# Patient Record
Sex: Female | Born: 1935 | Race: Black or African American | Hispanic: No | Marital: Single | Smoking: Never smoker
Health system: Southern US, Community
[De-identification: ages and names within clinical notes are randomized; demographics above are authoritative.]

## PROBLEM LIST (undated history)

## (undated) DIAGNOSIS — M1990 Unspecified osteoarthritis, unspecified site: Secondary | ICD-10-CM

## (undated) DIAGNOSIS — R627 Adult failure to thrive: Secondary | ICD-10-CM

## (undated) DIAGNOSIS — M109 Gout, unspecified: Secondary | ICD-10-CM

## (undated) DIAGNOSIS — R64 Cachexia: Secondary | ICD-10-CM

## (undated) DIAGNOSIS — D61818 Other pancytopenia: Secondary | ICD-10-CM

## (undated) DIAGNOSIS — I2510 Atherosclerotic heart disease of native coronary artery without angina pectoris: Secondary | ICD-10-CM

## (undated) DIAGNOSIS — H409 Unspecified glaucoma: Secondary | ICD-10-CM

## (undated) DIAGNOSIS — E785 Hyperlipidemia, unspecified: Secondary | ICD-10-CM

## (undated) DIAGNOSIS — I495 Sick sinus syndrome: Secondary | ICD-10-CM

## (undated) DIAGNOSIS — I10 Essential (primary) hypertension: Secondary | ICD-10-CM

## (undated) DIAGNOSIS — I119 Hypertensive heart disease without heart failure: Secondary | ICD-10-CM

## (undated) HISTORY — DX: Unspecified glaucoma: H40.9

## (undated) HISTORY — DX: Cachexia: R64

## (undated) HISTORY — DX: Essential (primary) hypertension: I10

## (undated) HISTORY — PX: CATARACT EXTRACTION: SUR2

## (undated) HISTORY — DX: Adult failure to thrive: R62.7

## (undated) HISTORY — DX: Sick sinus syndrome: I49.5

## (undated) HISTORY — PX: TOTAL HIP ARTHROPLASTY: SHX124

---

## 1998-09-28 ENCOUNTER — Encounter: Payer: Self-pay | Admitting: Cardiology

## 1998-09-28 ENCOUNTER — Ambulatory Visit (HOSPITAL_COMMUNITY): Admission: RE | Admit: 1998-09-28 | Discharge: 1998-09-28 | Payer: Self-pay | Admitting: Cardiology

## 1999-08-02 ENCOUNTER — Encounter: Admission: RE | Admit: 1999-08-02 | Discharge: 1999-08-02 | Payer: Self-pay | Admitting: Unknown Physician Specialty

## 1999-08-05 ENCOUNTER — Encounter: Admission: RE | Admit: 1999-08-05 | Discharge: 1999-08-05 | Payer: Self-pay

## 2003-11-20 ENCOUNTER — Emergency Department (HOSPITAL_COMMUNITY): Admission: EM | Admit: 2003-11-20 | Discharge: 2003-11-20 | Payer: Self-pay | Admitting: Emergency Medicine

## 2004-01-28 ENCOUNTER — Encounter: Admission: RE | Admit: 2004-01-28 | Discharge: 2004-01-28 | Payer: Self-pay

## 2004-02-14 ENCOUNTER — Encounter: Admission: RE | Admit: 2004-02-14 | Discharge: 2004-02-14 | Payer: Self-pay

## 2004-02-28 ENCOUNTER — Encounter: Admission: RE | Admit: 2004-02-28 | Discharge: 2004-02-28 | Payer: Self-pay

## 2004-03-14 ENCOUNTER — Encounter: Admission: RE | Admit: 2004-03-14 | Discharge: 2004-03-14 | Payer: Self-pay

## 2004-07-15 ENCOUNTER — Ambulatory Visit: Payer: Self-pay | Admitting: Internal Medicine

## 2004-09-27 ENCOUNTER — Ambulatory Visit: Payer: Self-pay | Admitting: Oncology

## 2004-11-12 ENCOUNTER — Ambulatory Visit: Payer: Self-pay | Admitting: Internal Medicine

## 2005-04-03 IMAGING — CR DG CHEST 1V PORT
1 series · 1 of 1 positions shown · non-contrast
Comparison: none

CLINICAL DATA: Arm and chest pain.  
 PORTABLE CHEST 
 AP view of the chest dated November 20, 2003 is reviewed without a prior film.  The heart is thought to be upper limits of normal in size.  No frank congestive failure is noted.  The markings are prominent. There is some soft tissue fullness in the right peritracheal region near the apex thought most likely to be vascular.  
 IMPRESSION
 Probably negative chest for active disease.  There is some fullness in the right peritracheal area which may be vascular.  PA and lateral views of the chest may be helpful in better resolving this when the patient?s condition permits.

[view not recorded]
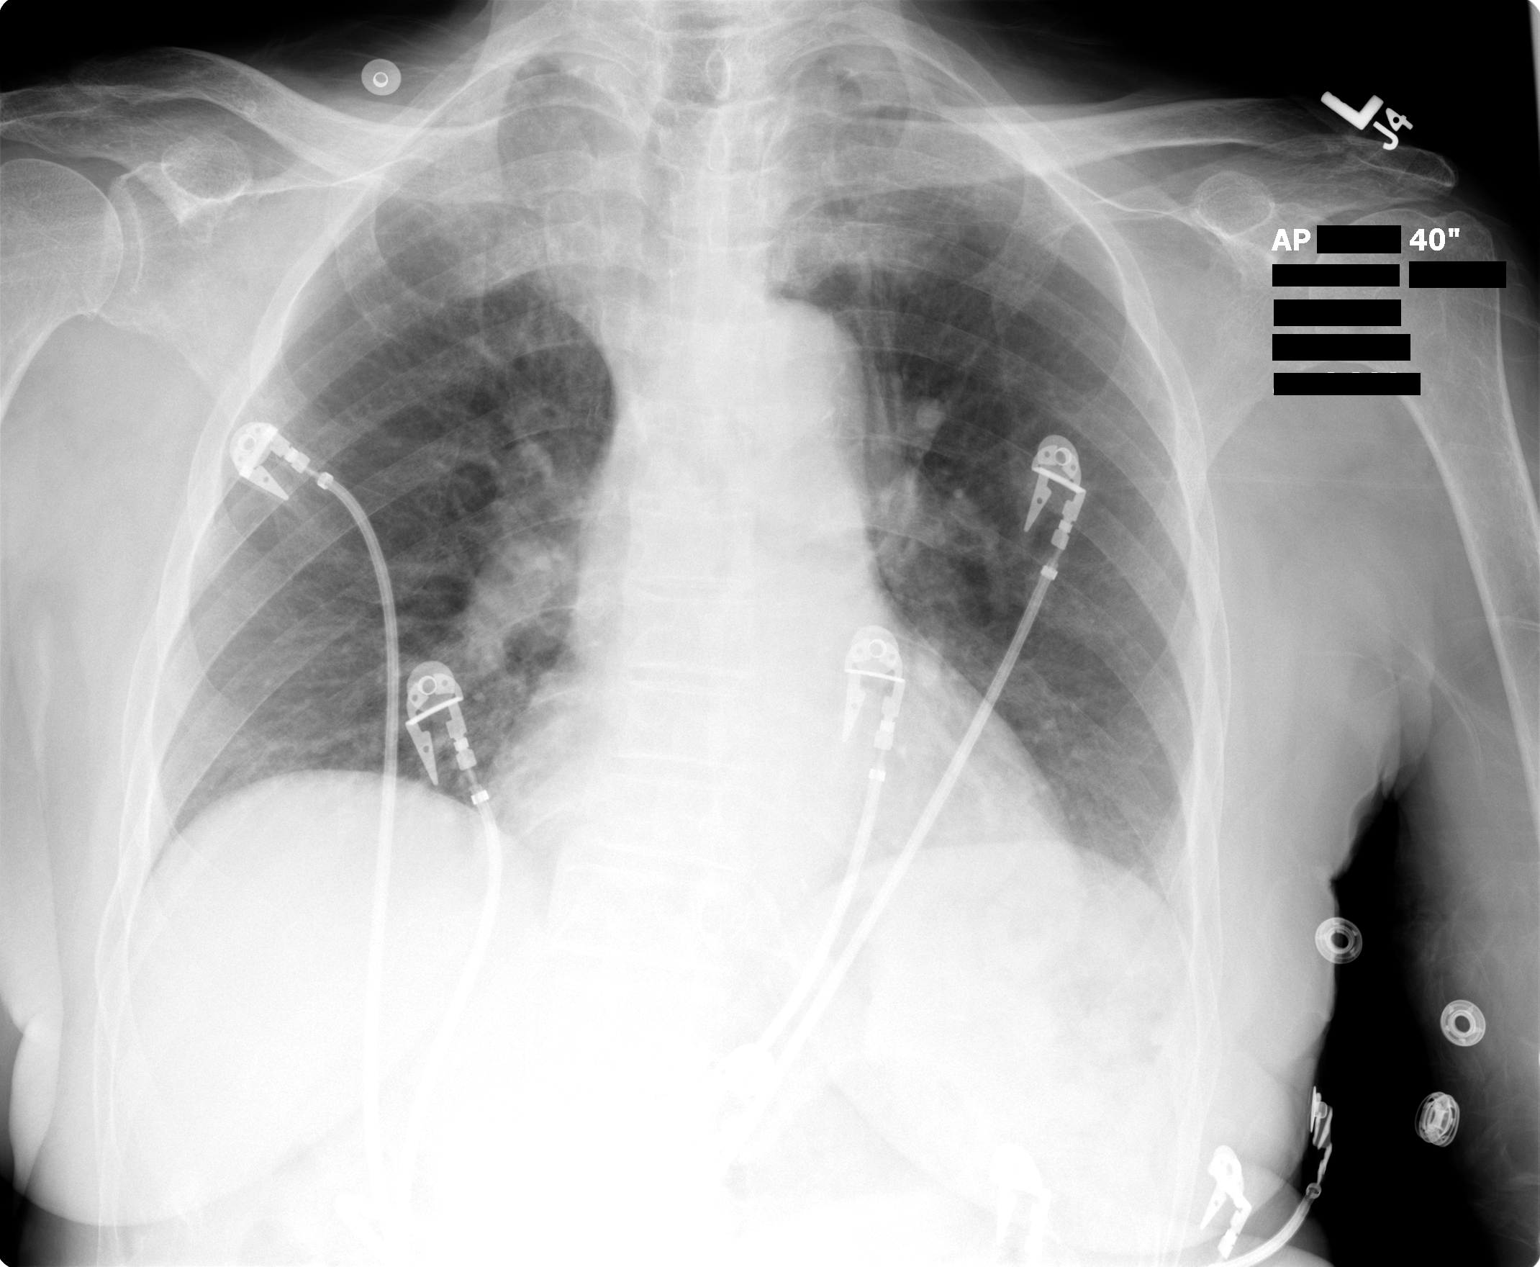

[1 of 1 positions shown; findings below may reference images not displayed]

## 2005-05-15 ENCOUNTER — Ambulatory Visit: Payer: Self-pay | Admitting: Internal Medicine

## 2005-11-14 ENCOUNTER — Ambulatory Visit: Payer: Self-pay | Admitting: Internal Medicine

## 2006-05-18 ENCOUNTER — Ambulatory Visit: Payer: Self-pay | Admitting: Internal Medicine

## 2006-05-18 LAB — CONVERTED CEMR LAB
Eosinophil percent: 6.8 % — ABNORMAL HIGH (ref 0.0–5.0)
HCT: 32.5 % — ABNORMAL LOW (ref 36.0–46.0)
Hemoglobin: 10.6 g/dL — ABNORMAL LOW (ref 12.0–15.0)
Lymphocytes Relative: 34.9 % (ref 12.0–46.0)
MCHC: 32.7 g/dL (ref 30.0–36.0)
MCV: 82.4 fL (ref 78.0–100.0)
Monocytes Absolute: 0.5 10*3/uL (ref 0.2–0.7)
Platelets: 113 10*3/uL — ABNORMAL LOW (ref 150–400)
RBC: 3.95 M/uL (ref 3.87–5.11)
RDW: 12.8 % (ref 11.5–14.6)
WBC: 3.5 10*3/uL — ABNORMAL LOW (ref 4.5–10.5)

## 2006-11-16 ENCOUNTER — Ambulatory Visit: Payer: Self-pay | Admitting: Internal Medicine

## 2006-11-16 LAB — CONVERTED CEMR LAB
ALT: 11 units/L (ref 0–40)
Albumin: 3.9 g/dL (ref 3.5–5.2)
Alkaline Phosphatase: 67 units/L (ref 39–117)
Basophils Relative: 0 % (ref 0.0–1.0)
Bilirubin, Direct: 0.1 mg/dL (ref 0.0–0.3)
Chloride: 110 meq/L (ref 96–112)
Cholesterol: 168 mg/dL (ref 0–200)
Eosinophils Absolute: 0.1 10*3/uL (ref 0.0–0.6)
GFR calc non Af Amer: 35 mL/min
HCT: 34 % — ABNORMAL LOW (ref 36.0–46.0)
Lymphocytes Relative: 25.2 % (ref 12.0–46.0)
MCHC: 33.8 g/dL (ref 30.0–36.0)
Monocytes Relative: 12.6 % — ABNORMAL HIGH (ref 3.0–11.0)
Neutrophils Relative %: 60.5 % (ref 43.0–77.0)
Platelets: 108 10*3/uL — ABNORMAL LOW (ref 150–400)
Potassium: 4.8 meq/L (ref 3.5–5.1)
Sodium: 145 meq/L (ref 135–145)
TSH: 2.7 microintl units/mL (ref 0.35–5.50)
WBC: 3.9 10*3/uL — ABNORMAL LOW (ref 4.5–10.5)

## 2007-05-17 ENCOUNTER — Ambulatory Visit: Payer: Self-pay | Admitting: Internal Medicine

## 2007-05-17 DIAGNOSIS — E785 Hyperlipidemia, unspecified: Secondary | ICD-10-CM

## 2007-05-17 DIAGNOSIS — I119 Hypertensive heart disease without heart failure: Secondary | ICD-10-CM

## 2007-05-17 DIAGNOSIS — M1990 Unspecified osteoarthritis, unspecified site: Secondary | ICD-10-CM | POA: Insufficient documentation

## 2007-05-17 DIAGNOSIS — M109 Gout, unspecified: Secondary | ICD-10-CM

## 2007-05-17 DIAGNOSIS — I2510 Atherosclerotic heart disease of native coronary artery without angina pectoris: Secondary | ICD-10-CM

## 2007-12-29 ENCOUNTER — Encounter: Admission: RE | Admit: 2007-12-29 | Discharge: 2007-12-29 | Payer: Self-pay

## 2008-02-01 ENCOUNTER — Encounter: Payer: Self-pay | Admitting: Internal Medicine

## 2008-03-15 ENCOUNTER — Ambulatory Visit: Payer: Self-pay | Admitting: Oncology

## 2008-03-20 ENCOUNTER — Encounter: Payer: Self-pay | Admitting: Internal Medicine

## 2008-03-20 LAB — CBC WITH DIFFERENTIAL/PLATELET
BASO%: 0.6 % (ref 0.0–2.0)
Basophils Absolute: 0 10*3/uL (ref 0.0–0.1)
MCHC: 33.4 g/dL (ref 32.0–36.0)
NEUT%: 48.1 % (ref 39.6–76.8)
Platelets: 105 10*3/uL — ABNORMAL LOW (ref 145–400)
RBC: 4.1 10*6/uL (ref 3.70–5.32)
RDW: 14.1 % (ref 11.3–14.5)

## 2008-09-14 ENCOUNTER — Ambulatory Visit: Payer: Self-pay | Admitting: Oncology

## 2008-09-18 ENCOUNTER — Encounter: Payer: Self-pay | Admitting: Internal Medicine

## 2008-09-18 LAB — CBC WITH DIFFERENTIAL/PLATELET
BASO%: 0.1 % (ref 0.0–2.0)
Basophils Absolute: 0 10*3/uL (ref 0.0–0.1)
Eosinophils Absolute: 0.2 10*3/uL (ref 0.0–0.5)
LYMPH%: 39.1 % (ref 14.0–49.7)
MCHC: 33.5 g/dL (ref 31.5–36.0)
MONO#: 0.4 10*3/uL (ref 0.1–0.9)
MONO%: 11.3 % (ref 0.0–14.0)
NEUT%: 44.7 % (ref 38.4–76.8)
Platelets: 107 10*3/uL — ABNORMAL LOW (ref 145–400)
RBC: 4.02 10*6/uL (ref 3.70–5.45)
RDW: 13.7 % (ref 11.2–14.5)

## 2008-10-18 ENCOUNTER — Encounter: Payer: Self-pay | Admitting: Internal Medicine

## 2008-10-23 ENCOUNTER — Encounter: Payer: Self-pay | Admitting: Internal Medicine

## 2008-12-14 ENCOUNTER — Ambulatory Visit: Payer: Self-pay | Admitting: Oncology

## 2009-01-29 ENCOUNTER — Ambulatory Visit: Payer: Self-pay | Admitting: Oncology

## 2011-09-02 DIAGNOSIS — Z961 Presence of intraocular lens: Secondary | ICD-10-CM | POA: Diagnosis not present

## 2011-09-02 DIAGNOSIS — H409 Unspecified glaucoma: Secondary | ICD-10-CM | POA: Diagnosis not present

## 2011-09-02 DIAGNOSIS — H4011X0 Primary open-angle glaucoma, stage unspecified: Secondary | ICD-10-CM | POA: Diagnosis not present

## 2011-11-03 DIAGNOSIS — E785 Hyperlipidemia, unspecified: Secondary | ICD-10-CM | POA: Diagnosis not present

## 2011-11-03 DIAGNOSIS — I209 Angina pectoris, unspecified: Secondary | ICD-10-CM | POA: Diagnosis not present

## 2011-11-03 DIAGNOSIS — R42 Dizziness and giddiness: Secondary | ICD-10-CM | POA: Diagnosis not present

## 2011-11-03 DIAGNOSIS — I119 Hypertensive heart disease without heart failure: Secondary | ICD-10-CM | POA: Diagnosis not present

## 2011-11-03 DIAGNOSIS — Z9861 Coronary angioplasty status: Secondary | ICD-10-CM | POA: Diagnosis not present

## 2011-11-03 DIAGNOSIS — I2510 Atherosclerotic heart disease of native coronary artery without angina pectoris: Secondary | ICD-10-CM | POA: Diagnosis not present

## 2011-12-26 ENCOUNTER — Other Ambulatory Visit: Payer: Self-pay | Admitting: Cardiology

## 2012-01-22 DIAGNOSIS — H4011X0 Primary open-angle glaucoma, stage unspecified: Secondary | ICD-10-CM | POA: Diagnosis not present

## 2012-05-19 DIAGNOSIS — I2510 Atherosclerotic heart disease of native coronary artery without angina pectoris: Secondary | ICD-10-CM | POA: Diagnosis not present

## 2012-05-19 DIAGNOSIS — I119 Hypertensive heart disease without heart failure: Secondary | ICD-10-CM | POA: Diagnosis not present

## 2012-05-19 DIAGNOSIS — E785 Hyperlipidemia, unspecified: Secondary | ICD-10-CM | POA: Diagnosis not present

## 2012-05-19 DIAGNOSIS — R42 Dizziness and giddiness: Secondary | ICD-10-CM | POA: Diagnosis not present

## 2012-05-19 DIAGNOSIS — Z9861 Coronary angioplasty status: Secondary | ICD-10-CM | POA: Diagnosis not present

## 2012-05-19 DIAGNOSIS — I209 Angina pectoris, unspecified: Secondary | ICD-10-CM | POA: Diagnosis not present

## 2012-07-22 DIAGNOSIS — H4011X0 Primary open-angle glaucoma, stage unspecified: Secondary | ICD-10-CM | POA: Diagnosis not present

## 2012-07-22 DIAGNOSIS — H409 Unspecified glaucoma: Secondary | ICD-10-CM | POA: Diagnosis not present

## 2012-11-17 DIAGNOSIS — I2510 Atherosclerotic heart disease of native coronary artery without angina pectoris: Secondary | ICD-10-CM | POA: Diagnosis not present

## 2012-11-17 DIAGNOSIS — I209 Angina pectoris, unspecified: Secondary | ICD-10-CM | POA: Diagnosis not present

## 2012-11-17 DIAGNOSIS — E785 Hyperlipidemia, unspecified: Secondary | ICD-10-CM | POA: Diagnosis not present

## 2012-11-17 DIAGNOSIS — R42 Dizziness and giddiness: Secondary | ICD-10-CM | POA: Diagnosis not present

## 2012-11-17 DIAGNOSIS — Z9861 Coronary angioplasty status: Secondary | ICD-10-CM | POA: Diagnosis not present

## 2012-11-17 DIAGNOSIS — I119 Hypertensive heart disease without heart failure: Secondary | ICD-10-CM | POA: Diagnosis not present

## 2012-12-07 DIAGNOSIS — R42 Dizziness and giddiness: Secondary | ICD-10-CM | POA: Diagnosis not present

## 2012-12-07 DIAGNOSIS — R5383 Other fatigue: Secondary | ICD-10-CM | POA: Diagnosis not present

## 2012-12-07 DIAGNOSIS — I2510 Atherosclerotic heart disease of native coronary artery without angina pectoris: Secondary | ICD-10-CM | POA: Diagnosis not present

## 2012-12-07 DIAGNOSIS — I209 Angina pectoris, unspecified: Secondary | ICD-10-CM | POA: Diagnosis not present

## 2012-12-07 DIAGNOSIS — Z9861 Coronary angioplasty status: Secondary | ICD-10-CM | POA: Diagnosis not present

## 2012-12-07 DIAGNOSIS — I119 Hypertensive heart disease without heart failure: Secondary | ICD-10-CM | POA: Diagnosis not present

## 2012-12-07 DIAGNOSIS — R5381 Other malaise: Secondary | ICD-10-CM | POA: Diagnosis not present

## 2012-12-07 DIAGNOSIS — R079 Chest pain, unspecified: Secondary | ICD-10-CM | POA: Diagnosis not present

## 2012-12-07 DIAGNOSIS — E785 Hyperlipidemia, unspecified: Secondary | ICD-10-CM | POA: Diagnosis not present

## 2012-12-07 DIAGNOSIS — I872 Venous insufficiency (chronic) (peripheral): Secondary | ICD-10-CM | POA: Diagnosis not present

## 2013-01-19 DIAGNOSIS — H4011X0 Primary open-angle glaucoma, stage unspecified: Secondary | ICD-10-CM | POA: Diagnosis not present

## 2013-05-17 ENCOUNTER — Inpatient Hospital Stay (HOSPITAL_COMMUNITY)
Admission: EM | Admit: 2013-05-17 | Discharge: 2013-05-23 | DRG: 281 | Disposition: A | Discharge status: Hospice | Payer: Medicare Other | Attending: Cardiology | Admitting: Cardiology

## 2013-05-17 ENCOUNTER — Other Ambulatory Visit: Payer: Self-pay | Admitting: Cardiology

## 2013-05-17 ENCOUNTER — Encounter (HOSPITAL_COMMUNITY): Payer: Self-pay | Admitting: Cardiology

## 2013-05-17 ENCOUNTER — Observation Stay: Admission: AD | Admit: 2013-05-17 | Payer: Self-pay | Source: Ambulatory Visit | Admitting: Cardiology

## 2013-05-17 DIAGNOSIS — Z79899 Other long term (current) drug therapy: Secondary | ICD-10-CM

## 2013-05-17 DIAGNOSIS — I200 Unstable angina: Secondary | ICD-10-CM | POA: Diagnosis present

## 2013-05-17 DIAGNOSIS — Z9861 Coronary angioplasty status: Secondary | ICD-10-CM

## 2013-05-17 DIAGNOSIS — I2510 Atherosclerotic heart disease of native coronary artery without angina pectoris: Secondary | ICD-10-CM | POA: Diagnosis present

## 2013-05-17 DIAGNOSIS — D649 Anemia, unspecified: Secondary | ICD-10-CM | POA: Diagnosis present

## 2013-05-17 DIAGNOSIS — M109 Gout, unspecified: Secondary | ICD-10-CM | POA: Diagnosis present

## 2013-05-17 DIAGNOSIS — E441 Mild protein-calorie malnutrition: Secondary | ICD-10-CM | POA: Diagnosis present

## 2013-05-17 DIAGNOSIS — M1990 Unspecified osteoarthritis, unspecified site: Secondary | ICD-10-CM

## 2013-05-17 DIAGNOSIS — Z7982 Long term (current) use of aspirin: Secondary | ICD-10-CM

## 2013-05-17 DIAGNOSIS — R079 Chest pain, unspecified: Secondary | ICD-10-CM

## 2013-05-17 DIAGNOSIS — E43 Unspecified severe protein-calorie malnutrition: Secondary | ICD-10-CM | POA: Diagnosis not present

## 2013-05-17 DIAGNOSIS — Z96649 Presence of unspecified artificial hip joint: Secondary | ICD-10-CM | POA: Diagnosis not present

## 2013-05-17 DIAGNOSIS — D61818 Other pancytopenia: Secondary | ICD-10-CM | POA: Diagnosis not present

## 2013-05-17 DIAGNOSIS — R64 Cachexia: Secondary | ICD-10-CM | POA: Diagnosis present

## 2013-05-17 DIAGNOSIS — I447 Left bundle-branch block, unspecified: Secondary | ICD-10-CM | POA: Diagnosis not present

## 2013-05-17 DIAGNOSIS — Z7902 Long term (current) use of antithrombotics/antiplatelets: Secondary | ICD-10-CM | POA: Diagnosis not present

## 2013-05-17 DIAGNOSIS — Z515 Encounter for palliative care: Secondary | ICD-10-CM | POA: Diagnosis not present

## 2013-05-17 DIAGNOSIS — E785 Hyperlipidemia, unspecified: Secondary | ICD-10-CM

## 2013-05-17 DIAGNOSIS — I10 Essential (primary) hypertension: Secondary | ICD-10-CM

## 2013-05-17 DIAGNOSIS — IMO0002 Reserved for concepts with insufficient information to code with codable children: Secondary | ICD-10-CM

## 2013-05-17 DIAGNOSIS — I214 Non-ST elevation (NSTEMI) myocardial infarction: Secondary | ICD-10-CM | POA: Diagnosis not present

## 2013-05-17 DIAGNOSIS — Z66 Do not resuscitate: Secondary | ICD-10-CM | POA: Diagnosis present

## 2013-05-17 DIAGNOSIS — I119 Hypertensive heart disease without heart failure: Secondary | ICD-10-CM | POA: Diagnosis present

## 2013-05-17 DIAGNOSIS — I219 Acute myocardial infarction, unspecified: Secondary | ICD-10-CM | POA: Diagnosis not present

## 2013-05-17 HISTORY — DX: Other pancytopenia: D61.818

## 2013-05-17 HISTORY — DX: Unspecified osteoarthritis, unspecified site: M19.90

## 2013-05-17 HISTORY — DX: Gout, unspecified: M10.9

## 2013-05-17 HISTORY — DX: Hypertensive heart disease without heart failure: I11.9

## 2013-05-17 HISTORY — DX: Hyperlipidemia, unspecified: E78.5

## 2013-05-17 HISTORY — DX: Atherosclerotic heart disease of native coronary artery without angina pectoris: I25.10

## 2013-05-17 LAB — BASIC METABOLIC PANEL
BUN: 16 mg/dL (ref 6–23)
CO2: 23 mEq/L (ref 19–32)
Calcium: 9.4 mg/dL (ref 8.4–10.5)
Chloride: 108 mEq/L (ref 96–112)
Creatinine, Ser: 1.35 mg/dL — ABNORMAL HIGH (ref 0.50–1.10)
GFR calc Af Amer: 37 mL/min — ABNORMAL LOW (ref >90)
GFR calc non Af Amer: 32 mL/min — ABNORMAL LOW (ref >90)
Glucose, Bld: 106 mg/dL — ABNORMAL HIGH (ref 70–99)
Potassium: 4.3 mEq/L (ref 3.5–5.1)
Sodium: 140 mEq/L (ref 135–145)

## 2013-05-17 LAB — CBC
HCT: 33 % — ABNORMAL LOW (ref 36.0–46.0)
Hemoglobin: 11.3 g/dL — ABNORMAL LOW (ref 12.0–15.0)
MCH: 27.2 pg (ref 26.0–34.0)
MCHC: 34.2 g/dL (ref 30.0–36.0)
MCV: 79.5 fL (ref 78.0–100.0)
Platelets: 138 10*3/uL — ABNORMAL LOW (ref 150–400)
RBC: 4.15 MIL/uL (ref 3.87–5.11)
RDW: 14.3 % (ref 11.5–15.5)
WBC: 4.2 10*3/uL (ref 4.0–10.5)

## 2013-05-17 LAB — PROTIME-INR
INR: 1.02 (ref 0.00–1.49)
Prothrombin Time: 13.2 seconds (ref 11.6–15.2)

## 2013-05-17 LAB — MRSA PCR SCREENING: MRSA by PCR: NEGATIVE

## 2013-05-17 LAB — APTT: aPTT: 30 seconds (ref 24–37)

## 2013-05-17 LAB — TROPONIN I: Troponin I: 1.26 ng/mL (ref <0.30)

## 2013-05-17 MED ORDER — AMLODIPINE BESYLATE 10 MG PO TABS
10.0000 mg | ORAL_TABLET | Freq: Every day | ORAL | Status: DC
  Administered 2013-05-18 – 2013-05-23 (×6): 10 mg via ORAL
  Filled 2013-05-17 (×6): qty 1

## 2013-05-17 MED ORDER — AMLODIPINE BESYLATE 10 MG PO TABS: 10.0000 mg | ORAL_TABLET | Freq: Every day | ORAL | Status: DC

## 2013-05-17 MED ORDER — SODIUM CHLORIDE 0.9 % IJ SOLN: 3.0000 mL | INTRAMUSCULAR | Status: DC | PRN

## 2013-05-17 MED ORDER — SODIUM CHLORIDE 0.9 % IV SOLN: 250.0000 mL | INTRAVENOUS | Status: DC | PRN

## 2013-05-17 MED ORDER — ACETAMINOPHEN 325 MG PO TABS: 650.0000 mg | ORAL_TABLET | ORAL | Status: DC | PRN

## 2013-05-17 MED ORDER — NITROGLYCERIN 0.4 MG SL SUBL: 0.4000 mg | SUBLINGUAL_TABLET | SUBLINGUAL | Status: DC | PRN

## 2013-05-17 MED ORDER — ASPIRIN EC 81 MG PO TBEC
81.0000 mg | DELAYED_RELEASE_TABLET | Freq: Every day | ORAL | Status: DC
  Administered 2013-05-18 – 2013-05-23 (×6): 81 mg via ORAL
  Filled 2013-05-17 (×6): qty 1

## 2013-05-17 MED ORDER — ATORVASTATIN CALCIUM 40 MG PO TABS
40.0000 mg | ORAL_TABLET | Freq: Every day | ORAL | Status: DC
  Administered 2013-05-18 – 2013-05-22 (×4): 40 mg via ORAL
  Filled 2013-05-17 (×6): qty 1

## 2013-05-17 MED ORDER — METOPROLOL TARTRATE 25 MG PO TABS
25.0000 mg | ORAL_TABLET | Freq: Two times a day (BID) | ORAL | Status: DC
  Filled 2013-05-17: qty 1

## 2013-05-17 MED ORDER — ONDANSETRON HCL 4 MG/2ML IJ SOLN
4.0000 mg | Freq: Four times a day (QID) | INTRAMUSCULAR | Status: DC | PRN
  Administered 2013-05-18: 4 mg via INTRAVENOUS
  Filled 2013-05-17: qty 2

## 2013-05-17 MED ORDER — MORPHINE SULFATE 2 MG/ML IJ SOLN: 2.0000 mg | INTRAMUSCULAR | Status: DC | PRN

## 2013-05-17 MED ORDER — ASPIRIN EC 81 MG PO TBEC: 81.0000 mg | DELAYED_RELEASE_TABLET | Freq: Every day | ORAL | Status: DC

## 2013-05-17 MED ORDER — SODIUM CHLORIDE 0.9 % IV SOLN
250.0000 mL | INTRAVENOUS | Status: DC | PRN
  Administered 2013-05-18 – 2013-05-19 (×2): 250 mL via INTRAVENOUS

## 2013-05-17 MED ORDER — SODIUM CHLORIDE 0.9 % IJ SOLN
3.0000 mL | Freq: Two times a day (BID) | INTRAMUSCULAR | Status: DC
  Administered 2013-05-18 – 2013-05-22 (×8): 3 mL via INTRAVENOUS

## 2013-05-17 MED ORDER — ATORVASTATIN CALCIUM 40 MG PO TABS
40.0000 mg | ORAL_TABLET | Freq: Every day | ORAL | Status: DC
  Filled 2013-05-17: qty 1

## 2013-05-17 MED ORDER — RANOLAZINE ER 500 MG PO TB12
500.0000 mg | ORAL_TABLET | Freq: Two times a day (BID) | ORAL | Status: DC
  Filled 2013-05-17: qty 1

## 2013-05-17 MED ORDER — ONDANSETRON HCL 4 MG/2ML IJ SOLN
4.0000 mg | Freq: Once | INTRAMUSCULAR | Status: AC
  Administered 2013-05-17: 4 mg via INTRAVENOUS
  Filled 2013-05-17: qty 2

## 2013-05-17 MED ORDER — NITROGLYCERIN IN D5W 200-5 MCG/ML-% IV SOLN
2.0000 ug/min | INTRAVENOUS | Status: DC
  Administered 2013-05-17: 10 ug/min via INTRAVENOUS
  Filled 2013-05-17: qty 250

## 2013-05-17 MED ORDER — ONDANSETRON HCL 4 MG/2ML IJ SOLN: 4.0000 mg | Freq: Four times a day (QID) | INTRAMUSCULAR | Status: DC | PRN

## 2013-05-17 MED ORDER — NITROGLYCERIN IN D5W 200-5 MCG/ML-% IV SOLN: 2.0000 ug/min | INTRAVENOUS | Status: DC

## 2013-05-17 MED ORDER — SODIUM CHLORIDE 0.9 % IJ SOLN: 3.0000 mL | Freq: Two times a day (BID) | INTRAMUSCULAR | Status: DC

## 2013-05-17 MED ORDER — RANOLAZINE ER 500 MG PO TB12
500.0000 mg | ORAL_TABLET | Freq: Two times a day (BID) | ORAL | Status: DC
  Administered 2013-05-18 – 2013-05-23 (×11): 500 mg via ORAL
  Filled 2013-05-17 (×12): qty 1

## 2013-05-17 MED ORDER — MORPHINE SULFATE 2 MG/ML IJ SOLN
2.0000 mg | INTRAMUSCULAR | Status: DC | PRN
  Administered 2013-05-18 – 2013-05-19 (×4): 2 mg via INTRAVENOUS
  Filled 2013-05-17 (×4): qty 1

## 2013-05-17 MED ORDER — HEPARIN (PORCINE) IN NACL 100-0.45 UNIT/ML-% IJ SOLN
650.0000 [IU]/h | INTRAMUSCULAR | Status: DC
  Administered 2013-05-17: 600 [IU]/h via INTRAVENOUS
  Administered 2013-05-19: 650 [IU]/h via INTRAVENOUS
  Filled 2013-05-17 (×3): qty 250

## 2013-05-17 MED ORDER — MORPHINE SULFATE 4 MG/ML IJ SOLN
4.0000 mg | Freq: Once | INTRAMUSCULAR | Status: AC
  Administered 2013-05-17: 4 mg via INTRAVENOUS
  Filled 2013-05-17: qty 1

## 2013-05-17 MED ORDER — HEPARIN BOLUS VIA INFUSION
3000.0000 [IU] | Freq: Once | INTRAVENOUS | Status: AC
  Administered 2013-05-17: 3000 [IU] via INTRAVENOUS
  Filled 2013-05-17: qty 3000

## 2013-05-17 MED ORDER — NITROGLYCERIN 2 % TD OINT: 1.0000 [in_us] | TOPICAL_OINTMENT | Freq: Four times a day (QID) | TRANSDERMAL | Status: DC

## 2013-05-17 MED ORDER — MORPHINE BOLUS VIA INFUSION: 2.0000 mg | INTRAVENOUS | Status: DC | PRN

## 2013-05-17 MED ORDER — METOPROLOL TARTRATE 25 MG PO TABS
25.0000 mg | ORAL_TABLET | Freq: Two times a day (BID) | ORAL | Status: DC
  Administered 2013-05-18 – 2013-05-23 (×11): 25 mg via ORAL
  Filled 2013-05-17 (×12): qty 1

## 2013-05-17 NOTE — Progress Notes (Signed)
ANTICOAGULATION CONSULT NOTE - Initial Consult  Pharmacy Consult for Heparin   Indication: chest pain/ACS  Allergies  Allergen Reactions  . Metoprolol     Bradycardia   . Simvastatin     Muscle aches     Patient Measurements:   Heparin Dosing Weight: n/a  Vital Signs: BP: 164/70 mmHg (11/11 2100) Pulse Rate: 70 (11/11 2100)  Labs:  Recent Labs  05/17/13 2004  HGB 11.3*  HCT 33.0*  PLT 138*  APTT 30  LABPROT 13.2  INR 1.02  CREATININE 1.35*  TROPONINI 1.26*    CrCl is unknown because there is no height on file for the current visit.   Medical History: Past Medical History  Diagnosis Date  . CAD Native   . Hyperlipidemia   . GOUT   . Hypertensive heart disease   . Osteoarthritis   . Pancytopenia     Medications:  Prescriptions prior to admission  Medication Sig Dispense Refill  . amLODipine (NORVASC) 10 MG tablet Take 10 mg by mouth daily.      Marland Kitchen aspirin 81 MG chewable tablet Chew by mouth daily.      . cycloSPORINE (RESTASIS) 0.05 % ophthalmic emulsion Place 1 drop into both eyes 2 (two) times daily.      . fish oil-omega-3 fatty acids 1000 MG capsule Take 1 g by mouth daily.      . isosorbide mononitrate (IMDUR) 60 MG 24 hr tablet Take 60 mg by mouth daily.      . nitroGLYCERIN (NITROSTAT) 0.4 MG SL tablet Place 0.4 mg under the tongue every 5 (five) minutes as needed for chest pain.      Marland Kitchen travoprost, benzalkonium, (TRAVATAN) 0.004 % ophthalmic solution Place 1 drop into both eyes at bedtime.        Assessment: 66 YOM admitted for treatment of severe chest pain and UA. She has a prior h/o CAD with new left bundle branch block on EKG. Trop 1.26, H/H 11.3/33 Plt 138. No signs of bleeding noted.   Goal of Therapy:  Heparin level 0.3-0.7 units/ml Monitor platelets by anticoagulation protocol: Yes   Plan:  Give 3000 units bolus x 1 Start heparin infusion at 600 units/hr Check anti-Xa level in 8 hours and daily while on heparin Continue to monitor  H&H and platelets  Vinnie Level, PharmD.  [place] License #[id number] Application for [place] reciprocity pending  Clinical Pharmacist Pager [phone]  I agree with the above.  Louie Casa, PharmD, BCPS 05/17/13, 10:12 PM

## 2013-05-17 NOTE — H&P (Signed)
Ashley Duran  Date of visit:  05/17/2013 DOB:  07-Sep-1912    Age:  79 yrs. Medical record number:  [id number]     Account number:  [id number] Primary Care Provider: Eleonore Chiquito F ____________________________ CURRENT DIAGNOSES  1. Angina Pectoris  2. Chest Pain  3. CAD,Native  4. Hypertensive Heart Disease-Benign without CHF  5. Hyperlipidemia  6. Surgery-PTCA  7. Other Pancytopenia  8. Arrhythmia-Sinus Bradycardia  9. Chronic venous insufficiency ____________________________ ALLERGIES  Metoprolol, Bradycardia  Simvastatin, Muscle aches ____________________________ MEDICATIONS  1. Fish Oil 1,000 mg capsule, 1 p.o. daily  2. aspirin 81 mg tablet, chewable, 1 p.o. daily  3. amlodipine 10 mg tablet, 1 p.o. daily  4. nitroglycerin 0.4 mg tablet, sublingual, PRN  5. Imdur 60 mg tablet,extended release, 1 p.o. daily ____________________________ CHIEF COMPLAINTS  Followup of Angina Pectoris  Followup of CAD,Native  Followup of Chest Pain ____________________________ HISTORY OF PRESENT ILLNESS  This 79 year old female is admitted to the hospital for treatment of severe chest pain and unstable angina pectoris. The patient has a prior history of significant coronary artery disease both large vessel and small vessel disease and has had chronic angina for years. She has also had hypertensive heart disease. She is stenting of the circumflex marginal branch in 1997 and later had PTCA done for in-stent restenosis and had subsequent catheterization in the year 2000 she had moderate disease but nothing to explain significant angina. She has continued to have angina over the years relieved with nitroglycerin as well as severe low back pain and arthritis. She has been seen in June at which time she was complaining of chest discomfort and talked almost nonstop. She described chest pressure when she exerted herself and was having difficulty with most any activity and did not wish to have  anything else done at the time. Her EKG was nonischemic then. She was brought to the office for an appointment today with severe chest discomfort that started today and has been having significant chest discomfort now for several days. She talks almost nonstop and states that she has discomfort with most any level of exertion. She was given 2 nitroglycerin in the office but continued to have chest pain and in addition has a new left bundle branch block on EKG. She does not wish to have intervention and does not wish to have resuscitative measures. ____________________________ PAST HISTORY  Past Medical Illnesses:  pancytopenia Dr. Truett Perna, hypertension, hypothyroidism, glaucoma, hyperlipidemia, osteoarthritis;  Cardiovascular Illnesses:  CAD;  Surgical Procedures:  hip replacement-left, cataract extraction OU;  Cardiology Procedures-Invasive:  PTCA CFX 1984, PTCA CFX intracoronary stent February 1997, PTCA CFX May 1997, in stent restenosis, cardiac cath (left) March 2000;  Cardiology Procedures-Noninvasive:  adenosine cardiolite December 2004;  Cardiac Cath Results:  normal Left main, 50% stenosis proximal LAD, widely patent CFX stent site, 30% stenosis CFX stent site, luminal irregualrities RCA;  LVEF of 80% documented via nuclear study on 06/28/2003,   ____________________________ CARDIO-PULMONARY TEST DATES EKG Date:  12/07/2012;   Cardiac Cath Date:  09/28/1998;  Stent Placement Date: 08/11/1995;  Nuclear Study Date:  06/28/2003;   ____________________________ FAMILY HISTORY Brother -- Brother dead Brother -- Unknown Disease, Brother dead, Deceased Father -- Father dead, Deceased Mother -- Unknown Disease, Mother dead, Deceased Sister -- Unknown Disease, Sister dead, Deceased Sister -- Unknown Disease, Sister dead ____________________________ SOCIAL HISTORY Alcohol Use:  does not use alcohol;  Smoking:  does not smoke;  Diet:  fat modified diet;  Lifestyle:  divorced and daughter  died in 1969;   Education:  did not complete high school;  Exercise:  walking;  Occupation:  retired;  Residence:  lives alone;   ____________________________ REVIEW OF SYSTEMS General:  malaise and fatigue  Integumentary:hair loss Eyes: wears eye glasses/contact lenses, decreased acuity, glaucoma Ears, Nose, Throat, Mouth:  tinnitus Respiratory: mild dyspnea with exertion Cardiovascular:  please review HPI Abdominal: denies dyspepsia, GI bleeding, constipation, or diarrheaGenitourinary-Female: frequency Musculoskeletal:  chronic back pain, gout, history of generalized arthritis, chronic venous insufficiency Neurological:  chronic ataxia  ____________________________ PHYSICAL EXAMINATION VITAL SIGNS  Blood Pressure:  168/70 Sitting, Right arm, regular cuff   Pulse:  76/min. Weight:  116.00 lbs. Height:  62"BMI: 21  Constitutional:  talkative, elderly, African American female complaining of chest pain Skin:  warm and dry to touch, no apparent skin lesions, or masses noted. Head:  normocephalic, hair is thinning Eyes:  EOMS Intact, PERRLA, C and S clear, Funduscopic exam not done. ENT:  ears, nose and throat unremarkable, full mouth dentures present Neck:  supple, no masses, thyromegaly, JVD. Carotid pulses are full and equal bilaterally without bruits. Chest:  clear to auscultation,  normal A-P diameter Cardiac:  regular rhythm, normal S1 and S2, No S3 or S4, no murmurs, gallops or rubs detected. Abdomen:  abdomen soft,non-tender, no masses, no hepatospenomegaly, or aneurysm noted Peripheral Pulses:  femoral pulses 2+, dorsalis pedis pulses diminished, posterior tibial pulses diminished Extremities & Back:  mild bilateral venous insufficiency changes present, edema of right leg Neurological:  ataxia present, unsteady on feet , uses cane ____________________________ MOST RECENT LIPID PANEL 04/29/10  CHOL TOTL 154 mg/dl, LDL 38 NM, HDL 79 mg/dl, TRIGLYCER 161 mg/dl and CHOL/HDL 2.0  (Calc) ____________________________ IMPRESSIONS/PLAN  1. Unstable angina pectoris possible myocardial infarction 2. New left bundle branch block 3. Coronary artery disease with angina 4. Hypertensive heart disease 5. Severe osteoarthritis 6. Pancytopenia  Recommendations:  The patient is having significant chest discomfort with a new left bundle branch block on EKG. She is almost 79 years old. She will be given intravenous morphine we will try to increase her anginal medications. Check serial enzymes. Otherwise conservative therapy. Initial trial of heparin to try to get her symptoms under control. ____________________________ TODAYS ORDERS  1. Admit to Hospital                       ____________________________ Cardiology Physician:  Darden Palmer MD [facility]

## 2013-05-17 NOTE — ED Notes (Signed)
Patient given warm blanket. Family at bedside. 

## 2013-05-17 NOTE — ED Notes (Signed)
Patient arrived via [facility] from Dr. York Spaniel office with active chest pain that has been intermittent x1 wk. Today's episode was increased pain thus the reason for transport to ER. Patient received Asprin 324mg , NTG SL 2 with relief. Dr. Donnie Aho made patient a DNR.

## 2013-05-17 NOTE — ED Provider Notes (Signed)
5:33 PM Pt seen briefly in ED shortly after arrival. From Dr York Spaniel office for direct admit. CP with new LBBB. Pt appears well and very talkative, but still endorsing CP. Morphine ordered while in ED. Heparin orders per pharmacy as well as admitting orders appear to be in.   Raeford Razor, MD 05/17/13 [phone]

## 2013-05-17 NOTE — ED Notes (Signed)
Called report to [place], RN unit 2C.

## 2013-05-18 DIAGNOSIS — I119 Hypertensive heart disease without heart failure: Secondary | ICD-10-CM | POA: Diagnosis not present

## 2013-05-18 DIAGNOSIS — I219 Acute myocardial infarction, unspecified: Secondary | ICD-10-CM | POA: Diagnosis not present

## 2013-05-18 DIAGNOSIS — R64 Cachexia: Secondary | ICD-10-CM | POA: Diagnosis not present

## 2013-05-18 DIAGNOSIS — D61818 Other pancytopenia: Secondary | ICD-10-CM | POA: Diagnosis not present

## 2013-05-18 DIAGNOSIS — I214 Non-ST elevation (NSTEMI) myocardial infarction: Secondary | ICD-10-CM | POA: Diagnosis not present

## 2013-05-18 LAB — COMPREHENSIVE METABOLIC PANEL
AST: 109 U/L — ABNORMAL HIGH (ref 0–37)
Albumin: 3.1 g/dL — ABNORMAL LOW (ref 3.5–5.2)
Alkaline Phosphatase: 64 U/L (ref 39–117)
BUN: 12 mg/dL (ref 6–23)
Potassium: 4.1 mEq/L (ref 3.5–5.1)
Sodium: 140 mEq/L (ref 135–145)
Total Protein: 7.2 g/dL (ref 6.0–8.3)

## 2013-05-18 LAB — CBC
HCT: 34.1 % — ABNORMAL LOW (ref 36.0–46.0)
MCHC: 32.8 g/dL (ref 30.0–36.0)
Platelets: 134 10*3/uL — ABNORMAL LOW (ref 150–400)
RDW: 14.4 % (ref 11.5–15.5)

## 2013-05-18 LAB — TSH: TSH: 5.822 u[IU]/mL — ABNORMAL HIGH (ref 0.350–4.500)

## 2013-05-18 LAB — HEPARIN LEVEL (UNFRACTIONATED)
Heparin Unfractionated: 0.51 IU/mL (ref 0.30–0.70)
Heparin Unfractionated: 0.3 IU/mL (ref 0.30–0.70)

## 2013-05-18 MED ORDER — ISOSORBIDE MONONITRATE ER 60 MG PO TB24
60.0000 mg | ORAL_TABLET | Freq: Every day | ORAL | Status: DC
  Administered 2013-05-18 – 2013-05-19 (×2): 60 mg via ORAL
  Filled 2013-05-18 (×3): qty 1

## 2013-05-18 NOTE — Progress Notes (Signed)
ANTICOAGULATION CONSULT NOTE - Follow Up Consult  Pharmacy Consult for Heparin Indication: chest pain/ACS  Allergies  Allergen Reactions  . Metoprolol     Bradycardia   . Simvastatin     Muscle aches     Patient Measurements: Height: 5\' 3"  (160 cm) (Simultaneous filing. User may not have seen previous data.) Weight: 110 lb 7.2 oz (50.1 kg) IBW/kg (Calculated) : 52.4 Heparin Dosing Weight: 50.1kg  Vital Signs: Temp: 98.3 F (36.8 C) (11/12 2017) Temp src: Oral (11/12 2017) BP: 134/57 mmHg (11/12 2017) Pulse Rate: 53 (11/12 2017)  Labs:  Recent Labs  05/17/13 2004 05/18/13 0948 05/18/13 1102 05/18/13 1710 05/18/13 1900  HGB 11.3* 11.2*  --   --   --   HCT 33.0* 34.1*  --   --   --   PLT 138* 134*  --   --   --   APTT 30  --   --   --   --   LABPROT 13.2  --   --   --   --   INR 1.02  --   --   --   --   HEPARINUNFRC  --  0.51  --   --  0.30  CREATININE 1.35*  --  1.18*  --   --   TROPONINI 1.26*  --  19.18* >20.00*  --     Estimated Creatinine Clearance: 21.1 ml/min (by C-G formula based on Cr of 1.18).   Medications:  Heparin 600 units/hr  Assessment: 89yof on heparin for CP/ACS. Heparin level (0.3) is therapeutic but trended down to low end of normal - will increase rate slightly and follow-up AM level and discontinuation plans. - H/H and Plts stable - No significant bleeding reported  Goal of Therapy:  Heparin level 0.3-0.7 units/ml Monitor platelets by anticoagulation protocol: Yes   Plan:  1. Increase heparin drip to 650 units/hr (6.5 ml/hr) 2. Follow-up AM heparin level and discontinuation plans  Cleon Dew [phone] 05/18/2013,8:24 PM

## 2013-05-18 NOTE — Progress Notes (Addendum)
ANTICOAGULATION CONSULT NOTE - Initial Consult  Pharmacy Consult for Heparin   Indication: chest pain/ACS  Allergies  Allergen Reactions  . Metoprolol     Bradycardia   . Simvastatin     Muscle aches     Patient Measurements: Height: 5\' 3"  (160 cm) (Simultaneous filing. User may not have seen previous data.) Weight: 110 lb 7.2 oz (50.1 kg) IBW/kg (Calculated) : 52.4 Heparin Dosing Weight: n/a  Vital Signs: Temp: 97.9 F (36.6 C) (11/12 1159) Temp src: Oral (11/12 1159) BP: 143/61 mmHg (11/12 1159) Pulse Rate: 66 (11/12 1159)  Labs:  Recent Labs  05/17/13 2004 05/18/13 0948 05/18/13 1102  HGB 11.3* 11.2*  --   HCT 33.0* 34.1*  --   PLT 138* 134*  --   APTT 30  --   --   LABPROT 13.2  --   --   INR 1.02  --   --   HEPARINUNFRC  --  0.51  --   CREATININE 1.35*  --  1.18*  TROPONINI 1.26*  --   --     Estimated Creatinine Clearance: 21.1 ml/min (by C-G formula based on Cr of 1.18).   Medical History: Past Medical History  Diagnosis Date  . CAD Native   . Hyperlipidemia   . GOUT   . Hypertensive heart disease   . Osteoarthritis   . Pancytopenia     Medications:  Prescriptions prior to admission  Medication Sig Dispense Refill  . amLODipine (NORVASC) 10 MG tablet Take 10 mg by mouth daily.      Marland Kitchen aspirin 81 MG chewable tablet Chew by mouth daily.      . cycloSPORINE (RESTASIS) 0.05 % ophthalmic emulsion Place 1 drop into both eyes 2 (two) times daily.      . fish oil-omega-3 fatty acids 1000 MG capsule Take 1 g by mouth daily.      . isosorbide mononitrate (IMDUR) 60 MG 24 hr tablet Take 60 mg by mouth daily.      . nitroGLYCERIN (NITROSTAT) 0.4 MG SL tablet Place 0.4 mg under the tongue every 5 (five) minutes as needed for chest pain.      Marland Kitchen travoprost, benzalkonium, (TRAVATAN) 0.004 % ophthalmic solution Place 1 drop into both eyes at bedtime.        Assessment: 16 YOM admitted for treatment of severe chest pain and UA. She has a prior h/o CAD with  new left bundle branch block on EKG. Plan to cont heparin for now. Level is therapeutic.   Goal of Therapy:  Heparin level 0.3-0.7 units/ml Monitor platelets by anticoagulation protocol: Yes   Plan:  Cont heparin at 600units/hr Check confirmation level  Ulyses Southward, PharmD  ____________________________ Agree with above plan.   Thank you for allowing pharmacy to be a part of this patients care team.  Lovenia Kim Pharm.D., BCPS Clinical Pharmacist 05/18/2013 7:44 PM Pager: [phone] Phone: [phone]

## 2013-05-18 NOTE — Progress Notes (Signed)
CRITICAL VALUE ALERT  Critical value received:  Trop 1.26  Date of notification:  05-17-2013  Time of notification: 2135  Critical value read back:yes  Nurse who received alert:  Herma Ard RN  MD notified (1st page):  Dr. Terressa Koyanagi  Time of first page:  2205  MD notified (2nd page):  Time of second page:  Responding MD:  Dr. Terressa Koyanagi  Time MD responded: 2205

## 2013-05-18 NOTE — Progress Notes (Signed)
Subjective:  Chest pain improved.  Trop positive.  LBBB On EKG.  Very talkative.  Objective:  Vital Signs in the last 24 hours: BP 159/74  Pulse 66  Temp(Src) 98.2 F (36.8 C) (Oral)  Resp 20  Ht 5\' 3"  (1.6 m)  Wt 50.1 kg (110 lb 7.2 oz)  BMI 19.57 kg/m2  SpO2 100%  Physical Exam: Talkative elderly BF appears younger than stated age in NAD Lungs:  Clear Cardiac:  Regular rhythm, normal S1 and S2, no S3 Abdomen:  Soft, nontender, no masses Extremities:  No edema present  Intake/Output from previous day:    Weight Filed Weights   05/17/13 2100 05/18/13 0416  Weight: 50.1 kg (110 lb 7.2 oz) 50.1 kg (110 lb 7.2 oz)    Lab Results: Basic Metabolic Panel:  Recent Labs  16/10/96 2004  NA 140  K 4.3  CL 108  CO2 23  GLUCOSE 106*  BUN 16  CREATININE 1.35*   CBC:  Recent Labs  05/17/13 2004  WBC 4.2  HGB 11.3*  HCT 33.0*  MCV 79.5  PLT 138*   Cardiac Enzymes:  Recent Labs  05/17/13 2004  TROPONINI 1.26*    Telemetry: Sinus with PVC's  Assessment/Plan: 1. Non STEMI 2. Severe angina unstable class 4 3. Hypertension 4. Advanced age  Rec:  Will d/c IV NTG and continue IV heparin another 24 hours.  Intensify medical therapy and hopefully keep pain under control.   Darden Palmer  MD [facility] Card[facility]  05/18/2013, 9:02 AM

## 2013-05-18 NOTE — Progress Notes (Signed)
Utilization Review Completed.  

## 2013-05-19 DIAGNOSIS — I2510 Atherosclerotic heart disease of native coronary artery without angina pectoris: Secondary | ICD-10-CM | POA: Diagnosis not present

## 2013-05-19 DIAGNOSIS — I214 Non-ST elevation (NSTEMI) myocardial infarction: Secondary | ICD-10-CM | POA: Diagnosis present

## 2013-05-19 DIAGNOSIS — I219 Acute myocardial infarction, unspecified: Secondary | ICD-10-CM | POA: Diagnosis not present

## 2013-05-19 LAB — CBC
Hemoglobin: 9.8 g/dL — ABNORMAL LOW (ref 12.0–15.0)
MCHC: 32.9 g/dL (ref 30.0–36.0)
MCV: 81.2 fL (ref 78.0–100.0)
RDW: 14.7 % (ref 11.5–15.5)
WBC: 4.7 10*3/uL (ref 4.0–10.5)

## 2013-05-19 LAB — HEPARIN LEVEL (UNFRACTIONATED): Heparin Unfractionated: 0.26 IU/mL — ABNORMAL LOW (ref 0.30–0.70)

## 2013-05-19 MED ORDER — TRAVOPROST 0.004 % OP SOLN: 1.0000 [drp] | Freq: Every day | OPHTHALMIC | Status: DC

## 2013-05-19 MED ORDER — CLOPIDOGREL BISULFATE 75 MG PO TABS
75.0000 mg | ORAL_TABLET | Freq: Every day | ORAL | Status: DC
  Administered 2013-05-20 – 2013-05-23 (×4): 75 mg via ORAL
  Filled 2013-05-19 (×4): qty 1

## 2013-05-19 MED ORDER — ENOXAPARIN SODIUM 30 MG/0.3ML ~~LOC~~ SOLN
30.0000 mg | SUBCUTANEOUS | Status: DC
  Administered 2013-05-19 – 2013-05-22 (×4): 30 mg via SUBCUTANEOUS
  Filled 2013-05-19 (×5): qty 0.3

## 2013-05-19 MED ORDER — LATANOPROST 0.005 % OP SOLN
1.0000 [drp] | Freq: Every day | OPHTHALMIC | Status: DC
  Administered 2013-05-19 – 2013-05-22 (×4): 1 [drp] via OPHTHALMIC
  Filled 2013-05-19: qty 2.5

## 2013-05-19 MED ORDER — ENOXAPARIN SODIUM 40 MG/0.4ML ~~LOC~~ SOLN: 40.0000 mg | SUBCUTANEOUS | Status: DC

## 2013-05-19 NOTE — Progress Notes (Signed)
ANTICOAGULATION CONSULT NOTE - Follow Up Consult  Pharmacy Consult for heparin Indication: NSTEMI  Labs:  Recent Labs  05/17/13 2004 05/18/13 0948 05/18/13 1102 05/18/13 1710 05/18/13 1900 05/19/13 0354  HGB 11.3* 11.2*  --   --   --  9.8*  HCT 33.0* 34.1*  --   --   --  29.8*  PLT 138* 134*  --   --   --  120*  APTT 30  --   --   --   --   --   LABPROT 13.2  --   --   --   --   --   INR 1.02  --   --   --   --   --   HEPARINUNFRC  --  0.51  --   --  0.30 0.26*  CREATININE 1.35*  --  1.18*  --   --   --   TROPONINI 1.26*  --  19.18* >20.00*  --   --     Assessment: 79yo female has been on heparin that was plan to stop today. Per Dr. York Spaniel note to change to lovenox.     Plan:  Dc IV heparin Lovenox 30 mg sq q24

## 2013-05-19 NOTE — Progress Notes (Signed)
Subjective:  Significant elevation of troponin since admit.  Continued LBBB.  Not SOB.  Chest pain considerably better.  Objective:  Vital Signs in the last 24 hours: BP 148/53  Pulse 66  Temp(Src) 98.5 F (36.9 C) (Oral)  Resp 16  Ht 5\' 3"  (1.6 m)  Wt 50 kg (110 lb 3.7 oz)  BMI 19.53 kg/m2  SpO2 97%  Physical Exam: Talkative elderly BF appears younger than stated age in NAD Lungs:  Clear Cardiac:  Regular rhythm, normal S1 and S2, no S3 Abdomen:  Soft, nontender, no masses Extremities:  No edema present  Intake/Output from previous day: 11/12 0701 - 11/13 0700 In: 680.9 [P.O.:320; I.V.:360.9] Out: 200 [Urine:200]  Weight Filed Weights   05/17/13 2100 05/18/13 0416 05/19/13 0402  Weight: 50.1 kg (110 lb 7.2 oz) 50.1 kg (110 lb 7.2 oz) 50 kg (110 lb 3.7 oz)    Lab Results: Basic Metabolic Panel:  Recent Labs  16/10/96 2004 05/18/13 1102  NA 140 140  K 4.3 4.1  CL 108 106  CO2 23 24  GLUCOSE 106* 111*  BUN 16 12  CREATININE 1.35* 1.18*   CBC:  Recent Labs  05/18/13 0948 05/19/13 0354  WBC 3.9* 4.7  HGB 11.2* 9.8*  HCT 34.1* 29.8*  MCV 81.4 81.2  PLT 134* 120*   Cardiac Enzymes:  Recent Labs  05/17/13 2004 05/18/13 1102 05/18/13 1710  TROPONINI 1.26* 19.18* >20.00*    Telemetry: Sinus with PVC's  Assessment/Plan: 1. Non STEMI 2. LBBB 3. Hypertensive heart disease 4. Advanced age 79. Anemia  Rec:  D/C IV heparin and change to Lovenox.  Add Plavix.  Check ECHO to eval LV function.  Move to floor and begin rehab.   Darden Palmer  MD [facility] Cardiology  05/19/2013, 8:33 AM

## 2013-05-19 NOTE — Progress Notes (Signed)
ANTICOAGULATION CONSULT NOTE - Follow Up Consult  Pharmacy Consult for heparin Indication: NSTEMI  Labs:  Recent Labs  05/17/13 2004 05/18/13 0948 05/18/13 1102 05/18/13 1710 05/18/13 1900 05/19/13 0354  HGB 11.3* 11.2*  --   --   --  9.8*  HCT 33.0* 34.1*  --   --   --  29.8*  PLT 138* 134*  --   --   --  120*  APTT 30  --   --   --   --   --   LABPROT 13.2  --   --   --   --   --   INR 1.02  --   --   --   --   --   HEPARINUNFRC  --  0.51  --   --  0.30 0.26*  CREATININE 1.35*  --  1.18*  --   --   --   TROPONINI 1.26*  --  19.18* >20.00*  --   --     Assessment: 79yo female now subtherapeutic on heparin after two levels at goal trending down; last pm note was written that intended to increase gtt slightly though order was never changed and nurse not notified so still running at prior rate; plan to stop heparin today per cards.  Goal of Therapy:  Heparin level 0.3-0.7 units/ml   Plan:  Will increase heparin byy by 1 unit/kg/hr to 650 units/hr and check level in 8hr (vs d/c when cards rounds on pt).  Vernard Gambles, PharmD, BCPS  05/19/2013,5:09 AM

## 2013-05-19 NOTE — Progress Notes (Signed)
Report called and given to receiving nurse on 3W

## 2013-05-20 ENCOUNTER — Encounter (HOSPITAL_COMMUNITY): Payer: Self-pay | Admitting: *Deleted

## 2013-05-20 DIAGNOSIS — I219 Acute myocardial infarction, unspecified: Secondary | ICD-10-CM | POA: Diagnosis not present

## 2013-05-20 LAB — CBC
HCT: 30.8 % — ABNORMAL LOW (ref 36.0–46.0)
Hemoglobin: 10.2 g/dL — ABNORMAL LOW (ref 12.0–15.0)
MCH: 26.8 pg (ref 26.0–34.0)
MCV: 80.8 fL (ref 78.0–100.0)
Platelets: 125 10*3/uL — ABNORMAL LOW (ref 150–400)
RBC: 3.81 MIL/uL — ABNORMAL LOW (ref 3.87–5.11)
WBC: 5.6 10*3/uL (ref 4.0–10.5)

## 2013-05-20 MED ORDER — HYDROCODONE-ACETAMINOPHEN 5-325 MG PO TABS
1.0000 | ORAL_TABLET | ORAL | Status: DC | PRN
  Administered 2013-05-20 – 2013-05-22 (×3): 1 via ORAL
  Filled 2013-05-20 (×3): qty 1

## 2013-05-20 MED ORDER — ISOSORBIDE MONONITRATE ER 60 MG PO TB24
120.0000 mg | ORAL_TABLET | Freq: Every day | ORAL | Status: DC
  Administered 2013-05-20 – 2013-05-23 (×4): 120 mg via ORAL
  Filled 2013-05-20 (×4): qty 2

## 2013-05-20 NOTE — Progress Notes (Signed)
CARDIAC REHAB PHASE I   PRE:  Rate/Rhythm: 75 SR  BP:  Supine:   Sitting: 130/58  Standing:    SaO2: 96%RA  MODE:  Ambulation: 360 ft   POST:  Rate/Rhythm: 69  BP:  Supine:   Sitting: 112/50  Standing:    SaO2: 97%RA 1320-1355 Pt walked 360 ft on RA with gait belt use, rolling walker and asst x 2 with slow steady gait. Uses cane at home and prefers to walker. Tolerated well. Denied CP. Talked whole walk. To bed after walk. No complaints. Can be asst x 1 next walk.    Luetta Nutting, RN BSN  05/20/2013 1:51 PM

## 2013-05-20 NOTE — Progress Notes (Signed)
  Echocardiogram 2D Echocardiogram has been performed.  Georgian Co 05/20/2013, 12:09 PM

## 2013-05-20 NOTE — Progress Notes (Signed)
Subjective:  Quite talkative. Very difficult to tell exactly how much chest pain she is having. Time she says she is comfortable at other times she says that she is having discomfort. Talks about wanting to go to heaven. Not any real acute distress and periodically requiring some morphine.  Objective:  Vital Signs in the last 24 hours: BP 144/57  Pulse 67  Temp(Src) 98 F (36.7 C) (Oral)  Resp 18  Ht 5\' 3"  (1.6 m)  Wt 50 kg (110 lb 3.7 oz)  BMI 19.53 kg/m2  SpO2 96%  Physical Exam: Talkative elderly BF appears younger than stated age in NAD Lungs:  Clear Cardiac:  Regular rhythm, normal S1 and S2, no S3 Abdomen:  Soft, nontender, no masses Extremities:  No edema present  Intake/Output from previous day: 11/13 0701 - 11/14 0700 In: 117 [P.O.:60; I.V.:57] Out: -   Weight Filed Weights   05/17/13 2100 05/18/13 0416 05/19/13 0402  Weight: 50.1 kg (110 lb 7.2 oz) 50.1 kg (110 lb 7.2 oz) 50 kg (110 lb 3.7 oz)    Lab Results: Basic Metabolic Panel:  Recent Labs  47/82/95 2004 05/18/13 1102  NA 140 140  K 4.3 4.1  CL 108 106  CO2 23 24  GLUCOSE 106* 111*  BUN 16 12  CREATININE 1.35* 1.18*   CBC:  Recent Labs  05/19/13 0354 05/20/13 0436  WBC 4.7 5.6  HGB 9.8* 10.2*  HCT 29.8* 30.8*  MCV 81.2 80.8  PLT 120* 125*   Cardiac Enzymes:  Recent Labs  05/17/13 2004 05/18/13 1102 05/18/13 1710  TROPONINI 1.26* 19.18* >20.00*    Telemetry: Sinus with PVC's  Assessment/Plan: 1. Non STEMI 2. LBBB 3. Hypertensive heart disease 4. Advanced age 79. Anemia  Rec:  I am asked for palliative care to see her to determine if she can have some home hospice services. I would like to try her on some oral narcotics if she has severe pain and may use morphine for breakthrough pain. Increase nitroglycerin.  Darden Palmer  MD [facility] Cardiology  05/20/2013, 8:47 AM

## 2013-05-21 DIAGNOSIS — R079 Chest pain, unspecified: Secondary | ICD-10-CM

## 2013-05-21 DIAGNOSIS — I2510 Atherosclerotic heart disease of native coronary artery without angina pectoris: Secondary | ICD-10-CM | POA: Diagnosis not present

## 2013-05-21 DIAGNOSIS — I119 Hypertensive heart disease without heart failure: Secondary | ICD-10-CM

## 2013-05-21 DIAGNOSIS — Z515 Encounter for palliative care: Secondary | ICD-10-CM | POA: Diagnosis not present

## 2013-05-21 DIAGNOSIS — E43 Unspecified severe protein-calorie malnutrition: Secondary | ICD-10-CM | POA: Diagnosis not present

## 2013-05-21 DIAGNOSIS — I447 Left bundle-branch block, unspecified: Secondary | ICD-10-CM

## 2013-05-21 DIAGNOSIS — I214 Non-ST elevation (NSTEMI) myocardial infarction: Secondary | ICD-10-CM | POA: Diagnosis not present

## 2013-05-21 LAB — CBC
Hemoglobin: 9.7 g/dL — ABNORMAL LOW (ref 12.0–15.0)
Platelets: 128 10*3/uL — ABNORMAL LOW (ref 150–400)
RBC: 3.62 MIL/uL — ABNORMAL LOW (ref 3.87–5.11)
WBC: 4.8 10*3/uL (ref 4.0–10.5)

## 2013-05-21 NOTE — Progress Notes (Signed)
Patient RU:Ashley Duran      DOB: 08-24-1912      WJX:[id number]  Met with [name] briefly last evening.  Plan to talk further after lunch on Saturday with her permission.  Niece [name] visiting at the time of my contact.   [name], MD MBA The Palliative Medicine Team at [facility] Phone: [phone] Pager: [phone]

## 2013-05-21 NOTE — Progress Notes (Signed)
  SUBJECTIVE:  No further CP or SOB since last night  OBJECTIVE:   Vitals:   Filed Vitals:   05/20/13 2132 05/21/13 0030 05/21/13 0510 05/21/13 1035  BP: 171/83 142/82 128/64 139/56  Pulse: 73  60 61  Temp: 98.7 F (37.1 C)  98.2 F (36.8 C)   TempSrc: Oral  Oral   Resp: 18  18   Height:      Weight:      SpO2: 96%  97%    I&O's:   Intake/Output Summary (Last 24 hours) at 05/21/13 1037 Last data filed at 05/21/13 1037  Gross per 24 hour  Intake    103 ml  Output      0 ml  Net    103 ml   TELEMETRY: Reviewed telemetry pt in NSR:     PHYSICAL EXAM General: Well developed, well nourished, in no acute distress Head: Eyes PERRLA, No xanthomas.   Normal cephalic and atramatic  Lungs:   Clear bilaterally to auscultation and percussion. Heart:   HRRR S1 S2 Pulses are 2+ & equal. Abdomen: Bowel sounds are positive, abdomen soft and non-tender without masses Extremities:   No clubbing, cyanosis or edema.  DP +1 Neuro: Alert and oriented X 3. Psych:  Good affect, responds appropriately   LABS: Basic Metabolic Panel:  Recent Labs  40/98/11 1102  NA 140  K 4.1  CL 106  CO2 24  GLUCOSE 111*  BUN 12  CREATININE 1.18*  CALCIUM 9.2   Liver Function Tests:  Recent Labs  05/18/13 1102  AST 109*  ALT 14  ALKPHOS 64  BILITOT 0.3  PROT 7.2  ALBUMIN 3.1*   No results found for this basename: LIPASE, AMYLASE,  in the last 72 hours CBC:  Recent Labs  05/20/13 0436 05/21/13 0600  WBC 5.6 4.8  HGB 10.2* 9.7*  HCT 30.8* 29.2*  MCV 80.8 80.7  PLT 125* 128*   Cardiac Enzymes:  Recent Labs  05/18/13 1102 05/18/13 1710  TROPONINI 19.18* >20.00*   BNP: No components found with this basename: POCBNP,  D-Dimer: No results found for this basename: DDIMER,  in the last 72 hours Hemoglobin A1C: No results found for this basename: HGBA1C,  in the last 72 hours Fasting Lipid Panel: No results found for this basename: CHOL, HDL, LDLCALC, TRIG, CHOLHDL,  LDLDIRECT,  in the last 72 hours Thyroid Function Tests:  Recent Labs  05/18/13 1102  TSH 5.822*   Anemia Panel: No results found for this basename: VITAMINB12, FOLATE, FERRITIN, TIBC, IRON, RETICCTPCT,  in the last 72 hours Coag Panel:   Lab Results  Component Value Date   INR 1.02 05/17/2013    RADIOLOGY: No results found.  Assessment/Plan:  1. Non STEMI  2. LBBB  3. Hypertensive heart disease with fairly good BP control 4. Advanced age  79. Anemia   Rec:  1.  Palliative care consult in progress.  They are to meet with family today to determine if she can have some home hospice services. Started on oral narcotics for severe pain and may use morphine for breakthrough pain.  2.  Continue Plavix/ASA/statin/nitrates and beta blocker 3.  Continue with conservative management give her very advanced age   Quintella Reichert, MD  05/21/2013  10:37 AM

## 2013-05-22 DIAGNOSIS — I119 Hypertensive heart disease without heart failure: Secondary | ICD-10-CM | POA: Diagnosis not present

## 2013-05-22 DIAGNOSIS — I2510 Atherosclerotic heart disease of native coronary artery without angina pectoris: Secondary | ICD-10-CM | POA: Diagnosis not present

## 2013-05-22 DIAGNOSIS — I447 Left bundle-branch block, unspecified: Secondary | ICD-10-CM | POA: Diagnosis not present

## 2013-05-22 DIAGNOSIS — I214 Non-ST elevation (NSTEMI) myocardial infarction: Secondary | ICD-10-CM | POA: Diagnosis not present

## 2013-05-22 LAB — CBC
HCT: 28.1 % — ABNORMAL LOW (ref 36.0–46.0)
Hemoglobin: 9.3 g/dL — ABNORMAL LOW (ref 12.0–15.0)
MCV: 81 fL (ref 78.0–100.0)
Platelets: 139 10*3/uL — ABNORMAL LOW (ref 150–400)
RBC: 3.47 MIL/uL — ABNORMAL LOW (ref 3.87–5.11)
WBC: 4.2 10*3/uL (ref 4.0–10.5)

## 2013-05-22 NOTE — Consult Note (Signed)
Patient YN:WGNFAO Ashley Duran      DOB: 11-18-1912      ZHY:[id number]     Consult Note from the Palliative Medicine Team at [facility]    Consult Requested by: Dr. Donnie Aho    PCP: No PCP Per Patient Reason for Consultation: Assist with possible hosice     Phone [phone] services Assessment of patients Current state: 79 yr old african [place] female who lives in [place] alone, but with some local family namely her daughter and grandson Ashley Duran (known as Ashley Duran) admitted with recurrent chest pain.  Patient with known CAD s/p stenting currently receiving medical management.  Patient relates she likes living alone in her little apartment.  She relates that she is ready to go on when that time comes and she hopes it is soon. She was open to having hospice help her at the house to be more comfortable.  She gave me permission to talk with her grandson in the morning.     Goals of Care: 1.  Code Status: DNR   2. Scope of Treatment: Continue medical management.  She relates she would likely prefer to come back to the hospital if she feels sick.  A MOST for could be completed prior to discharge.  Choice will be requested for home with hospice.   4. Disposition: HOme with hospice when medically stable.  Primary Dx would be CAD/Angina   3. Symptom Management:   1. Pain: agree with trial of prn hyrdocodone or could use morphine tablets or liquid if it was really persistent.  She has not required any pain meds in the last few days. 2. Nutritional supplements would be reasonable as she undernourished. 3. Consider PT eval before discharge  4. Psychosocial:  She has a son and daughter who are local but they don't really pay her much mind.  Her grandson is the most attentive.  5. Spiritual: She is not super religious but knows that the chaplains are availalbe.       Patient Documents Completed or Given: Document Given Completed  Advanced Directives Pkt    MOST    DNR     Gone from My Sight    Hard Choices      Brief HPI: 79 yr old african [place] female admitted with recurrent angina.  Patinent relates she is tired and she will do what she is supposed to each day but she kinda hopes to go on soon.  We were asked to assist with possible hospice services. ROS: decreased appetite, fatigue, weakness, intermittent chest pain more with exertion    PMH:  Past Medical History  Diagnosis Date  . CAD Native   . Hyperlipidemia   . GOUT   . Hypertensive heart disease   . Osteoarthritis   . Pancytopenia      PSH: Past Surgical History  Procedure Laterality Date  . Total hip arthroplasty Left   . Cataract extraction     I have reviewed the FH and SH and  If appropriate update it with new information. Allergies  Allergen Reactions  . Metoprolol     Bradycardia   . Simvastatin     Muscle aches    Scheduled Meds: . amLODipine  10 mg Oral Daily  . aspirin EC  81 mg Oral Daily  . atorvastatin  40 mg Oral q1800  . clopidogrel  75 mg Oral Q breakfast  . enoxaparin (LOVENOX) injection  30 mg Subcutaneous Q24H  . isosorbide mononitrate  120 mg Oral  Daily  . latanoprost  1 drop Both Eyes QHS  . metoprolol tartrate  25 mg Oral BID  . ranolazine  500 mg Oral BID  . sodium chloride  3 mL Intravenous Q12H   Continuous Infusions:  PRN Meds:.acetaminophen, HYDROcodone-acetaminophen, morphine injection, nitroGLYCERIN, ondansetron (ZOFRAN) IV, sodium chloride    BP 126/47  Pulse 56  Temp(Src) 98.7 F (37.1 C) (Oral)  Resp 16  Ht 5\' 3"  (1.6 m)  Wt 50 kg (110 lb 3.7 oz)  BMI 19.53 kg/m2  SpO2 100%   PPS: 50%   Intake/Output Summary (Last 24 hours) at 05/22/13 0702 Last data filed at 05/21/13 1256  Gross per 24 hour  Intake    223 ml  Output      0 ml  Net    223 ml   LBM: 05/21/13                        Physical Exam:  General: cachectic, no acute distress HEENT:  PERRL, EOMI, anicteric, mmm, dentures Chest:  Decreased but clear  anteriorly CVS: regular, S1, S2 Abdomen:soft, scaphoid Ext: warm no edema Neuro:awake , alert , oriented to time place and appears to have capacity for decision making  Labs: CBC    Component Value Date/Time   WBC 4.2 05/22/2013 0518   WBC 3.3* 09/18/2008 0956   RBC 3.47* 05/22/2013 0518   RBC 4.02 09/18/2008 0956   HGB 9.3* 05/22/2013 0518   HGB 11.1* 09/18/2008 0956   HCT 28.1* 05/22/2013 0518   HCT 33.2* 09/18/2008 0956   PLT 139* 05/22/2013 0518   PLT 107* 09/18/2008 0956   MCV 81.0 05/22/2013 0518   MCV 82.7 09/18/2008 0956   MCH 26.8 05/22/2013 0518   MCH 27.7 09/18/2008 0956   MCHC 33.1 05/22/2013 0518   MCHC 33.5 09/18/2008 0956   RDW 14.5 05/22/2013 0518   RDW 13.7 09/18/2008 0956   LYMPHSABS 1.3 09/18/2008 0956   MONOABS 0.4 09/18/2008 0956   MONOABS 0.5 11/16/2006 1101   EOSABS 0.2 09/18/2008 0956   EOSABS 0.1 11/16/2006 1101   BASOSABS 0.0 09/18/2008 0956   BASOSABS 0.0 11/16/2006 1101      CMP     Component Value Date/Time   NA 140 05/18/2013 1102   K 4.1 05/18/2013 1102   CL 106 05/18/2013 1102   CO2 24 05/18/2013 1102   GLUCOSE 111* 05/18/2013 1102   BUN 12 05/18/2013 1102   CREATININE 1.18* 05/18/2013 1102   CALCIUM 9.2 05/18/2013 1102   PROT 7.2 05/18/2013 1102   ALBUMIN 3.1* 05/18/2013 1102   AST 109* 05/18/2013 1102   ALT 14 05/18/2013 1102   ALKPHOS 64 05/18/2013 1102   BILITOT 0.3 05/18/2013 1102   GFRNONAA 37* 05/18/2013 1102   GFRAA 43* 05/18/2013 1102       Time In Time Out Total Time Spent with Patient Total Overall Time  530 pm 610 pm 40 min    Greater than 50%  of this time was spent counseling and coordinating care related to the above assessment and plan.   Ashley Duran L. Ladona Ridgel, MD MBA The Palliative Medicine Team at [facility] Phone: [phone] Pager: [phone]

## 2013-05-22 NOTE — Progress Notes (Signed)
Patient Ashley Duran      DOB: 23-Jan-1913      VWU:[id number]  [name] asked me to update her greatgrandson [name]( aka [name]) regarding hospice services.  [name]'s mother has POA for [name] which would not be instituted unless she was unable to make decisions for herself.  At this time she has capacity for choice.  I will , therefore , check with [name] in the am about updating her daughter.  Originally , she asked me to just talk with [name].  Will gain permission to update patient's daughter in the am.   [name] L. Ladona Ridgel, MD MBA The Palliative Medicine Team at [facility] Phone: [phone] Pager: [phone]

## 2013-05-22 NOTE — Progress Notes (Signed)
  SUBJECTIVE:  No complaints  OBJECTIVE:   Vitals:   Filed Vitals:   05/21/13 1317 05/21/13 2020 05/22/13 0525 05/22/13 1015  BP: 124/51 143/43 126/47 142/58  Pulse: 57 65 56 62  Temp: 98.1 F (36.7 C) 98.8 F (37.1 C) 98.7 F (37.1 C)   TempSrc: Oral Oral Oral   Resp: 17 16 16    Height:      Weight:      SpO2: 96% 96% 100%    I&O's:   Intake/Output Summary (Last 24 hours) at 05/22/13 1025 Last data filed at 05/21/13 1256  Gross per 24 hour  Intake    123 ml  Output      0 ml  Net    123 ml   TELEMETRY: Reviewed telemetry pt in NSR:     PHYSICAL EXAM General: Well developed, well nourished, in no acute distress Head: Eyes PERRLA, No xanthomas.   Normal cephalic and atramatic  Lungs:   Clear bilaterally to auscultation and percussion. Heart:   HRRR S1 S2 Pulses are 2+ & equal Abdomen: Bowel sounds are positive, abdomen soft and non-tender without masses Extremities:   No clubbing, cyanosis or edema.  DP +1 Neuro: Alert and oriented X 3. Psych:  Good affect, responds appropriately   LABS: Basic Metabolic Panel: No results found for this basename: NA, K, CL, CO2, GLUCOSE, BUN, CREATININE, CALCIUM, MG, PHOS,  in the last 72 hours Liver Function Tests: No results found for this basename: AST, ALT, ALKPHOS, BILITOT, PROT, ALBUMIN,  in the last 72 hours No results found for this basename: LIPASE, AMYLASE,  in the last 72 hours CBC:  Recent Labs  05/21/13 0600 05/22/13 0518  WBC 4.8 4.2  HGB 9.7* 9.3*  HCT 29.2* 28.1*  MCV 80.7 81.0  PLT 128* 139*   Coag Panel:   Lab Results  Component Value Date   INR 1.02 05/17/2013    RADIOLOGY: No results found.  Assessment/Plan:  1. Non STEMI - medical management 2. LBBB  3. Hypertensive heart disease with fairly good BP control  4. Advanced age  3. Anemia  Rec:  1. Palliative care consult in progress.Started on oral narcotics for severe pain and may use morphine for breakthrough pain.  2. Continue  Plavix/ASA/statin/nitrates and beta blocker  3. Continue with conservative management give her very advanced age  79.  Further treatment per Dr. Lorene Dy, MD  05/22/2013  10:25 AM

## 2013-05-22 NOTE — Evaluation (Signed)
Physical Therapy Evaluation Patient Details Name: Ashley Duran MRN: [id number] DOB: 1912-10-17 Today's Date: 05/22/2013 Time: 4098-1191 PT Time Calculation (min): 25 min  PT Assessment / Plan / Recommendation History of Present Illness  79 y.o. female admitted with NSTEMI.    Clinical Impression  Pt admitted with NSTEMI.  She presents at baseline mobility level.  Spoke with pt RE: HHPT and RW for strengthening, mobility training.  Pt's desire at this time is for no f/u services and to continue ambulation with SPC.  PT in agreement that this is the most appropriate plan for her at this time.  No further PT needs.  PT signing off.    PT Assessment  Patent does not need any further PT services    Follow Up Recommendations  No PT follow up    Does the patient have the potential to tolerate intense rehabilitation      Barriers to Discharge        Equipment Recommendations  None recommended by PT    Recommendations for Other Services     Frequency      Precautions / Restrictions Precautions Precautions: None   Pertinent Vitals/Pain 0/10      Mobility  Bed Mobility Bed Mobility: Supine to Sit;Sit to Supine Supine to Sit: 6: Modified independent (Device/Increase time) Sit to Supine: 6: Modified independent (Device/Increase time) Transfers Transfers: Sit to Stand;Stand to Sit Sit to Stand: 5: Supervision;From bed;With upper extremity assist Stand to Sit: 5: Supervision;To chair/3-in-1;With upper extremity assist;With armrests Ambulation/Gait Ambulation/Gait Assistance: 4: Min guard Ambulation Distance (Feet): 100 Feet Assistive device: 1 person hand held assist Gait Pattern: Trunk flexed;Decreased stride length Gait velocity: decreased General Gait Details: mild SOB after ambulation    Exercises     PT Diagnosis:    PT Problem List:   PT Treatment Interventions:       PT Goals(Current goals can be found in the care plan section) Acute Rehab PT  Goals Patient Stated Goal: return to apartment and regular routine  Visit Information  Last PT Received On: 05/22/13 Assistance Needed: +1 History of Present Illness: 79 y.o. female admitted with NSTEMI.         Prior Functioning  Home Living Family/patient expects to be discharged to:: Private residence Living Arrangements: Alone Available Help at Discharge: Family;Friend(s);Available PRN/intermittently Type of Home: Apartment Home Access: Elevator Home Layout: One level Home Equipment: Cane - quad;Cane - single point Prior Function Level of Independence: Independent with assistive device(s) Communication Communication: No difficulties    Cognition  Cognition Arousal/Alertness: Awake/alert Behavior During Therapy: WFL for tasks assessed/performed Overall Cognitive Status: Within Functional Limits for tasks assessed    Extremity/Trunk Assessment     Balance    End of Session PT - End of Session Equipment Utilized During Treatment: Gait belt Activity Tolerance: Patient tolerated treatment well Patient left: in chair;with call bell/phone within reach  GP     Ilda Foil 05/22/2013, 3:08 PM  Aida Raider, PT  Office # [phone] Pager [phone]

## 2013-05-23 LAB — CBC
HCT: 27.8 % — ABNORMAL LOW (ref 36.0–46.0)
Hemoglobin: 9.5 g/dL — ABNORMAL LOW (ref 12.0–15.0)
MCH: 27 pg (ref 26.0–34.0)
MCHC: 34.2 g/dL (ref 30.0–36.0)
MCV: 79 fL (ref 78.0–100.0)
RBC: 3.52 MIL/uL — ABNORMAL LOW (ref 3.87–5.11)

## 2013-05-23 MED ORDER — METOPROLOL TARTRATE 25 MG PO TABS: 25.0000 mg | ORAL_TABLET | Freq: Two times a day (BID) | ORAL | Status: DC

## 2013-05-23 MED ORDER — HYDROCODONE-ACETAMINOPHEN 5-325 MG PO TABS: 1.0000 | ORAL_TABLET | ORAL | Status: DC | PRN

## 2013-05-23 MED ORDER — RANOLAZINE ER 500 MG PO TB12: 500.0000 mg | ORAL_TABLET | Freq: Two times a day (BID) | ORAL | Status: DC

## 2013-05-23 MED ORDER — BOOST / RESOURCE BREEZE PO LIQD: 1.0000 | Freq: Two times a day (BID) | ORAL | Status: DC

## 2013-05-23 MED ORDER — ATORVASTATIN CALCIUM 40 MG PO TABS: 40.0000 mg | ORAL_TABLET | Freq: Every day | ORAL | Status: DC

## 2013-05-23 MED ORDER — ISOSORBIDE MONONITRATE ER 120 MG PO TB24: 120.0000 mg | ORAL_TABLET | Freq: Every day | ORAL | Status: DC

## 2013-05-23 MED ORDER — CLOPIDOGREL BISULFATE 75 MG PO TABS: 75.0000 mg | ORAL_TABLET | Freq: Every day | ORAL | Status: DC

## 2013-05-23 NOTE — Progress Notes (Signed)
Notified by Marlow Baars, patient and family request services of Hospcie and Palliative Care of [facility]) after discharge.  Writer spoke with patient and great grandson Judieth Keens (c: [phone]) at bedside - [name] aware Dr Donnie Aho had spoken with patient this morning and discussed possibly initiating hospice services after patient sees Dr Donnie Aho next week- however [name] feels he and his mother/ HCPOA-Carolyn Clovis Riley (who he spoke to via phone at [phone]; with this RN and CMRN in room) would rather see Hospice services initiated as soon after discharge as possible, if Dr Donnie Aho is agreeable - patient stated she is agreeable to this as well- briefly  initiated education related to hospice services, philosophy and team approach to care with good understanding voiced by patient and great grandson [name] Dr Donnie Aho was contacted by Roselyn Bering [facility] and he is agreeable with [facility] initiating services at first available appointment which is tomorrow, Tuesday 05/24/13 @ approximately 10:30 am.  Per notes plan is to d/c to home today by personal vehicle- Judieth Keens at bedside ready to transport patient and is aware and agreeable to [facility] assessment nurse seeing patient tomorrow    DME needs discussed currently patient has no equipment needs at the home per [facility] and [facility] Campion Patient's attending physician with [facility] is Dr Donnie Aho; pharmacy [facility] Initial paperwork faxed to [facility]  Please notify [facility] when patient is ready to leave unit at d/c call [phone] (or [phone] if after 5 pm);  [facility] information and contact numbers also given to [facility] during visit.   Above information shared with [facility] Please call with any questions or concerns   Valente David, RN 05/23/2013, 11:32 AM [facility] Palliative Medicine Team RN Liaison [phone]

## 2013-05-23 NOTE — Care Management Note (Signed)
    Page 1 of 1   05/23/2013     11:24:12 AM   CARE MANAGEMENT NOTE 05/23/2013  Patient:  Ashley Duran, Ashley Duran   Account Number:  [id number]  Date Initiated:  05/18/2013  Documentation initiated by:  MAYO,HENRIETTA  Subjective/Objective Assessment:   adm with dx of [place]; lives alone in [place]     Action/Plan:   Anticipated DC Date:     Anticipated DC Plan:        DC Planning Services  CM consult      PAC Choice  HOSPICE   Choice offered to / List presented to:  C-1 Patient        HH arranged  HH-1 RN      HH agency  [facility]    Status of service:  Completed, signed off Medicare Important Message given?   (If response is "NO", the following Medicare IM given date fields will be blank) Date Medicare IM given:   Date Additional Medicare IM given:    Discharge Disposition:  HOME W HOSPICE CARE  Per UR Regulation:  Reviewed for med. necessity/level of care/duration of stay  If discussed at Long Length of Stay Meetings, dates discussed:    Comments:  05-23-13 [address], [place] [phone] CM and Valente David Palliative Care Liasion for [facility] met with pt  and grandson Romeo Apple. Pt was undecided at first about which avenue to take either Hospice vs [facility]. Pervis Hocking called Mother HCPOA Eber Jones andthey bothe decided Hospice will be the best choice at this time. CM did speak to [place] and pt will not need any DME at this time. CM did call Dr. Donnie Aho and clarify orders for Hospice at this time and he provided telephone order for Hospice Choice. Dr Donnie Aho is agreeable to being the Primary MD for Hospice to contact. No further needs from CM at this time.

## 2013-05-23 NOTE — Progress Notes (Signed)
INITIAL NUTRITION ASSESSMENT  DOCUMENTATION CODES Per approved criteria  -Severe malnutrition in the context of chronic illness   INTERVENTION:  Resource Breeze PO BID, each supplement provides 250 kcal and 9 grams of protein.  NUTRITION DIAGNOSIS: Malnutrition related to advanced age and inadequate oral intake as evidenced by severe depletion of subcutaneous fat and muscle mass.   Goal: Intake to meet >90% of estimated nutrition needs.  Monitor:  PO intake, labs, weight trend.  Reason for Assessment: MD Consult for assessment of nutrition requirements/status  79 y.o. female  Admitting Dx: Non-STEMI (non-ST elevated myocardial infarction)  ASSESSMENT: Patient admitted to the hospital on 11/11 for treatment of severe chest pain and unstable angina pectoris with a new left bundle branch block on EKG. The patient has a prior history of significant coronary artery disease both large vessel and small vessel disease and has had chronic angina for years. She has also had hypertensive heart disease. Palliative Care Team is following patient with plans for D/C with home Hospice.   Patient reports that she's "never been a fat person." She eats small amounts 3-4 times per day. She does not eat when she is not hungry. She does not like Ensure or Boost, but agreed to try Resource Breeze to maximize oral intake.  Nutrition Focused Physical Exam:  Subcutaneous Fat:  Orbital Region: severe depletion Upper Arm Region: mild-moderate depletion Thoracic and Lumbar Region: mild-moderate depletion  Muscle:  Temple Region: severe depletion Clavicle Bone Region: severe depletion Clavicle and Acromion Bone Region: severe depletion Scapular Bone Region: mild-moderate depletion Dorsal Hand: mild-moderate depletion Patellar Region: mild-moderate depletion Anterior Thigh Region: mild-moderate depletion Posterior Calf Region: mild-moderate depletion  Edema: none   Height: Ht Readings from Last 1  Encounters:  05/17/13 5\' 3"  (1.6 m)    Weight: Wt Readings from Last 1 Encounters:  05/19/13 110 lb 3.7 oz (50 kg)    Ideal Body Weight: 52.3 kg  % Ideal Body Weight: 96%  Wt Readings from Last 10 Encounters:  05/19/13 110 lb 3.7 oz (50 kg)  05/17/07 125 lb (56.7 kg)    Usual Body Weight: 120 lb per patient ~6 months ago  % Usual Body Weight: 92%  BMI:  Body mass index is 19.53 kg/(m^2).  Estimated Nutritional Needs: Kcal: 1200-1400 Protein: 70-80 gm Fluid: 1.2-1.4 L  Skin: no wounds  Diet Order: Cardiac  EDUCATION NEEDS: -No education needs identified at this time   Intake/Output Summary (Last 24 hours) at 05/23/13 0823 Last data filed at 05/22/13 1700  Gross per 24 hour  Intake    240 ml  Output      0 ml  Net    240 ml    Last BM: 11/15   Labs:   Recent Labs Lab 05/17/13 2004 05/18/13 1102  NA 140 140  K 4.3 4.1  CL 108 106  CO2 23 24  BUN 16 12  CREATININE 1.35* 1.18*  CALCIUM 9.4 9.2  GLUCOSE 106* 111*    CBG (last 3)  No results found for this basename: GLUCAP,  in the last 72 hours  Scheduled Meds: . amLODipine  10 mg Oral Daily  . aspirin EC  81 mg Oral Daily  . atorvastatin  40 mg Oral q1800  . clopidogrel  75 mg Oral Q breakfast  . enoxaparin (LOVENOX) injection  30 mg Subcutaneous Q24H  . isosorbide mononitrate  120 mg Oral Daily  . latanoprost  1 drop Both Eyes QHS  . metoprolol tartrate  25 mg  Oral BID  . ranolazine  500 mg Oral BID  . sodium chloride  3 mL Intravenous Q12H    Continuous Infusions:   Past Medical History  Diagnosis Date  . CAD Native   . Hyperlipidemia   . GOUT   . Hypertensive heart disease   . Osteoarthritis   . Pancytopenia     Past Surgical History  Procedure Laterality Date  . Total hip arthroplasty Left   . Cataract extraction      Joaquin Courts, RD, LDN, CNSC Pager [phone] After Hours Pager [phone]

## 2013-05-23 NOTE — Discharge Summary (Addendum)
Physician Discharge Summary  Patient ID: Ashley Duran MRN: [id number] DOB/AGE: Jan 23, 1913 79 y.o.  Admit date: 05/17/2013 Discharge date: 05/23/2013  Primary Physician: Dr. Joretta Bachelor  Primary Discharge Diagnosis:  1. Acute non-ST elevation myocardial infarction with left bundle branch block  Secondary Discharge Diagnosis: 2. Coronary artery disease 3. Hypertensive heart disease 4. Pancytopenia 5. Gallop 6. Osteoarthritis 7. Mild malnutrition with low albumin  Procedures:  Echocardiogram  Consults:  Hospice and palliative care  Hospital Course: This 79 year old female is admitted to the hospital for treatment of severe chest pain and unstable angina pectoris. The patient has a prior history of significant coronary artery disease both large vessel and small vessel disease and has had chronic angina for years. She has also had hypertensive heart disease. She is stenting of the circumflex marginal branch in 1997 and later had PTCA done for in-stent restenosis and had subsequent catheterization in the year 2000 she had moderate disease but nothing to explain significant angina. She has continued to have angina over the years relieved with nitroglycerin as well as severe low back pain and arthritis. She has been seen in June at which time she was complaining of chest discomfort and talked almost nonstop. She described chest pressure when she exerted herself and was having difficulty with most any activity and did not wish to have anything else done at the time. Her EKG was nonischemic then. She was brought to the office for an appointment today with severe chest discomfort that started today and has been having significant chest discomfort now for several days. She talks almost nonstop and states that she has discomfort with most any level of exertion. She was given 2 nitroglycerin in the office but continued to have chest pain and in addition has a new left bundle branch block on  EKG. She does not wish to have intervention and does not wish to have resuscitation.  The patient was admitted to the hospital and placed on intravenous heparin. Her troponins subsequently returned greater than 20. A subsequent  echocardiogram showed an ejection fraction of around 40-45% she continued to have some chest pain he was treated with morphine. She was able to be moved to the floor and was quite talkative. She was able to ambulate with cardiac rehabilitation without significant chest pain and at times would complain of pain with exertion and a lot of times would not. Beta blockers were added to her regimen and Lasix was added and she was placed on room no losing. Her blood pressure came under better control and she was seen by the palliative care services. She did not wish to have intervention or any aggressive medical therapy and was a DO NOT RESUSCITATE. Her albumin was low and her BMI was underweight and she was thought to have some evidence of malnutrition. She wanted to go back to her apartment and so arrangements will be made for her to go back to her apartment on an intensified medical regimen. She will be given hydrocodone to use for severe pain and is encouraged to continue to use nitroglycerin. She will be seen by followup with me in one week and we will get home health care to see her and she will decide if she wants to have hospice services involved.   Discharge Exam: Blood pressure 129/46, pulse 54, temperature 98.7 F (37.1 C), temperature source Oral, resp. rate 15, height 5\' 3"  (1.6 m), weight 50 kg (110 lb 3.7 oz), SpO2 97.00%.   Pleasant black female who  is thin and in no acute distress, lungs clear, normal S1-S2, no S3  Labs: CBC:   Lab Results  Component Value Date   WBC 3.4* 05/23/2013   HGB 9.5* 05/23/2013   HCT 27.8* 05/23/2013   MCV 79.0 05/23/2013   PLT 150 05/23/2013    CMP:  Recent Labs Lab 05/18/13 1102  NA 140  K 4.1  CL 106  CO2 24  BUN 12   CREATININE 1.18*  CALCIUM 9.2  PROT 7.2  BILITOT 0.3  ALKPHOS 64  ALT 14  AST 109*  GLUCOSE 111*   Thyroid: Lab Results  Component Value Date   TSH 5.822* 05/18/2013   EKG: Sinus with left bundle branch block  Discharge Medications:   Medication List         amLODipine 10 MG tablet  Commonly known as:  NORVASC  Take 10 mg by mouth daily.     aspirin 81 MG chewable tablet  Chew by mouth daily.     atorvastatin 40 MG tablet  Commonly known as:  LIPITOR  Take 1 tablet (40 mg total) by mouth daily at 6 PM.     clopidogrel 75 MG tablet  Commonly known as:  PLAVIX  Take 1 tablet (75 mg total) by mouth daily with breakfast.     cycloSPORINE 0.05 % ophthalmic emulsion  Commonly known as:  RESTASIS  Place 1 drop into both eyes 2 (two) times daily.     fish oil-omega-3 fatty acids 1000 MG capsule  Take 1 g by mouth daily.     HYDROcodone-acetaminophen 5-325 MG per tablet  Commonly known as:  NORCO/VICODIN  Take 1 tablet by mouth every 4 (four) hours as needed for moderate pain.     isosorbide mononitrate 120 MG 24 hr tablet  Commonly known as:  IMDUR  Take 1 tablet (120 mg total) by mouth daily.     metoprolol tartrate 25 MG tablet  Commonly known as:  LOPRESSOR  Take 1 tablet (25 mg total) by mouth 2 (two) times daily.     nitroGLYCERIN 0.4 MG SL tablet  Commonly known as:  NITROSTAT  Place 0.4 mg under the tongue every 5 (five) minutes as needed for chest pain.     ranolazine 500 MG 12 hr tablet  Commonly known as:  RANEXA  Take 1 tablet (500 mg total) by mouth 2 (two) times daily.     travoprost (benzalkonium) 0.004 % ophthalmic solution  Commonly known as:  TRAVATAN  Place 1 drop into both eyes at bedtime.         Followup plans and appointments: Followup Dr. Donnie Aho in one week. She is also seen by home health. She may need to have home hospice services involved but she is undecided about this at that time.  Time spent with patient to include  physician time:  45 minutes  Signed: W. Ashley Royalty. MD [facility] 05/23/2013, 8:54 AM

## 2013-05-23 NOTE — Progress Notes (Signed)
CARDIAC REHAB PHASE I  Reviewed NTG/CP with pt. Agrees that she will pull her call bell at home if she has CP. She doesn't like taking NTG. Grandson present. Pt sts she doesn't want a RW but she will use her cane. [id number]  Elissa Lovett [place] CES, ACSM 05/23/2013 10:56 AM

## 2013-05-24 DIAGNOSIS — I2510 Atherosclerotic heart disease of native coronary artery without angina pectoris: Secondary | ICD-10-CM | POA: Diagnosis not present

## 2013-05-25 DIAGNOSIS — I2510 Atherosclerotic heart disease of native coronary artery without angina pectoris: Secondary | ICD-10-CM | POA: Diagnosis not present

## 2013-05-27 DIAGNOSIS — I2510 Atherosclerotic heart disease of native coronary artery without angina pectoris: Secondary | ICD-10-CM | POA: Diagnosis not present

## 2013-05-30 DIAGNOSIS — I2510 Atherosclerotic heart disease of native coronary artery without angina pectoris: Secondary | ICD-10-CM | POA: Diagnosis not present

## 2013-06-06 DIAGNOSIS — I2510 Atherosclerotic heart disease of native coronary artery without angina pectoris: Secondary | ICD-10-CM | POA: Diagnosis not present

## 2013-06-07 DIAGNOSIS — I209 Angina pectoris, unspecified: Secondary | ICD-10-CM | POA: Diagnosis not present

## 2013-06-07 DIAGNOSIS — I495 Sick sinus syndrome: Secondary | ICD-10-CM | POA: Diagnosis not present

## 2013-06-07 DIAGNOSIS — I119 Hypertensive heart disease without heart failure: Secondary | ICD-10-CM | POA: Diagnosis not present

## 2013-06-07 DIAGNOSIS — Z9861 Coronary angioplasty status: Secondary | ICD-10-CM | POA: Diagnosis not present

## 2013-06-07 DIAGNOSIS — E785 Hyperlipidemia, unspecified: Secondary | ICD-10-CM | POA: Diagnosis not present

## 2013-06-07 DIAGNOSIS — R079 Chest pain, unspecified: Secondary | ICD-10-CM | POA: Diagnosis not present

## 2013-06-07 DIAGNOSIS — I872 Venous insufficiency (chronic) (peripheral): Secondary | ICD-10-CM | POA: Diagnosis not present

## 2013-06-07 DIAGNOSIS — I2510 Atherosclerotic heart disease of native coronary artery without angina pectoris: Secondary | ICD-10-CM | POA: Diagnosis not present

## 2013-07-07 DIAGNOSIS — I2510 Atherosclerotic heart disease of native coronary artery without angina pectoris: Secondary | ICD-10-CM | POA: Diagnosis not present

## 2013-07-22 DIAGNOSIS — H4011X0 Primary open-angle glaucoma, stage unspecified: Secondary | ICD-10-CM | POA: Diagnosis not present

## 2013-08-07 DIAGNOSIS — I2510 Atherosclerotic heart disease of native coronary artery without angina pectoris: Secondary | ICD-10-CM | POA: Diagnosis not present

## 2013-08-23 DIAGNOSIS — I209 Angina pectoris, unspecified: Secondary | ICD-10-CM | POA: Diagnosis not present

## 2013-08-23 DIAGNOSIS — I2510 Atherosclerotic heart disease of native coronary artery without angina pectoris: Secondary | ICD-10-CM | POA: Diagnosis not present

## 2013-09-04 DIAGNOSIS — I2510 Atherosclerotic heart disease of native coronary artery without angina pectoris: Secondary | ICD-10-CM | POA: Diagnosis not present

## 2013-09-08 DIAGNOSIS — Z9861 Coronary angioplasty status: Secondary | ICD-10-CM | POA: Diagnosis not present

## 2013-09-08 DIAGNOSIS — I2510 Atherosclerotic heart disease of native coronary artery without angina pectoris: Secondary | ICD-10-CM | POA: Diagnosis not present

## 2013-09-08 DIAGNOSIS — I495 Sick sinus syndrome: Secondary | ICD-10-CM | POA: Diagnosis not present

## 2013-09-08 DIAGNOSIS — I209 Angina pectoris, unspecified: Secondary | ICD-10-CM | POA: Diagnosis not present

## 2013-09-08 DIAGNOSIS — I872 Venous insufficiency (chronic) (peripheral): Secondary | ICD-10-CM | POA: Diagnosis not present

## 2013-09-08 DIAGNOSIS — D61818 Other pancytopenia: Secondary | ICD-10-CM | POA: Diagnosis not present

## 2013-09-08 DIAGNOSIS — E785 Hyperlipidemia, unspecified: Secondary | ICD-10-CM | POA: Diagnosis not present

## 2013-09-08 DIAGNOSIS — I119 Hypertensive heart disease without heart failure: Secondary | ICD-10-CM | POA: Diagnosis not present

## 2013-10-05 DIAGNOSIS — I2510 Atherosclerotic heart disease of native coronary artery without angina pectoris: Secondary | ICD-10-CM | POA: Diagnosis not present

## 2013-11-04 DIAGNOSIS — I2510 Atherosclerotic heart disease of native coronary artery without angina pectoris: Secondary | ICD-10-CM | POA: Diagnosis not present

## 2013-11-04 DIAGNOSIS — R64 Cachexia: Secondary | ICD-10-CM | POA: Diagnosis not present

## 2013-11-04 DIAGNOSIS — E785 Hyperlipidemia, unspecified: Secondary | ICD-10-CM | POA: Diagnosis not present

## 2013-11-04 DIAGNOSIS — I10 Essential (primary) hypertension: Secondary | ICD-10-CM | POA: Diagnosis not present

## 2013-11-04 DIAGNOSIS — I495 Sick sinus syndrome: Secondary | ICD-10-CM | POA: Diagnosis not present

## 2013-12-05 DIAGNOSIS — I2510 Atherosclerotic heart disease of native coronary artery without angina pectoris: Secondary | ICD-10-CM | POA: Diagnosis not present

## 2013-12-05 DIAGNOSIS — R64 Cachexia: Secondary | ICD-10-CM | POA: Diagnosis not present

## 2013-12-05 DIAGNOSIS — I495 Sick sinus syndrome: Secondary | ICD-10-CM | POA: Diagnosis not present

## 2013-12-05 DIAGNOSIS — E785 Hyperlipidemia, unspecified: Secondary | ICD-10-CM | POA: Diagnosis not present

## 2013-12-05 DIAGNOSIS — I10 Essential (primary) hypertension: Secondary | ICD-10-CM | POA: Diagnosis not present

## 2014-01-04 DIAGNOSIS — E785 Hyperlipidemia, unspecified: Secondary | ICD-10-CM | POA: Diagnosis not present

## 2014-01-04 DIAGNOSIS — I10 Essential (primary) hypertension: Secondary | ICD-10-CM | POA: Diagnosis not present

## 2014-01-04 DIAGNOSIS — I2510 Atherosclerotic heart disease of native coronary artery without angina pectoris: Secondary | ICD-10-CM | POA: Diagnosis not present

## 2014-01-04 DIAGNOSIS — I495 Sick sinus syndrome: Secondary | ICD-10-CM | POA: Diagnosis not present

## 2014-01-04 DIAGNOSIS — R64 Cachexia: Secondary | ICD-10-CM | POA: Diagnosis not present

## 2014-01-30 DIAGNOSIS — I119 Hypertensive heart disease without heart failure: Secondary | ICD-10-CM | POA: Diagnosis not present

## 2014-01-30 DIAGNOSIS — I495 Sick sinus syndrome: Secondary | ICD-10-CM | POA: Diagnosis not present

## 2014-01-30 DIAGNOSIS — I2510 Atherosclerotic heart disease of native coronary artery without angina pectoris: Secondary | ICD-10-CM | POA: Diagnosis not present

## 2014-02-04 DIAGNOSIS — I10 Essential (primary) hypertension: Secondary | ICD-10-CM | POA: Diagnosis not present

## 2014-02-04 DIAGNOSIS — R64 Cachexia: Secondary | ICD-10-CM | POA: Diagnosis not present

## 2014-02-04 DIAGNOSIS — I495 Sick sinus syndrome: Secondary | ICD-10-CM | POA: Diagnosis not present

## 2014-02-04 DIAGNOSIS — I2510 Atherosclerotic heart disease of native coronary artery without angina pectoris: Secondary | ICD-10-CM | POA: Diagnosis not present

## 2014-02-04 DIAGNOSIS — E785 Hyperlipidemia, unspecified: Secondary | ICD-10-CM | POA: Diagnosis not present

## 2014-03-07 DIAGNOSIS — R64 Cachexia: Secondary | ICD-10-CM | POA: Diagnosis not present

## 2014-03-07 DIAGNOSIS — I495 Sick sinus syndrome: Secondary | ICD-10-CM | POA: Diagnosis not present

## 2014-03-07 DIAGNOSIS — H409 Unspecified glaucoma: Secondary | ICD-10-CM | POA: Diagnosis not present

## 2014-03-07 DIAGNOSIS — I10 Essential (primary) hypertension: Secondary | ICD-10-CM | POA: Diagnosis not present

## 2014-03-07 DIAGNOSIS — E785 Hyperlipidemia, unspecified: Secondary | ICD-10-CM | POA: Diagnosis not present

## 2014-03-07 DIAGNOSIS — M159 Polyosteoarthritis, unspecified: Secondary | ICD-10-CM | POA: Diagnosis not present

## 2014-03-07 DIAGNOSIS — I2510 Atherosclerotic heart disease of native coronary artery without angina pectoris: Secondary | ICD-10-CM | POA: Diagnosis not present

## 2014-04-03 DIAGNOSIS — E785 Hyperlipidemia, unspecified: Secondary | ICD-10-CM | POA: Diagnosis not present

## 2014-04-03 DIAGNOSIS — I495 Sick sinus syndrome: Secondary | ICD-10-CM | POA: Diagnosis not present

## 2014-04-03 DIAGNOSIS — I2510 Atherosclerotic heart disease of native coronary artery without angina pectoris: Secondary | ICD-10-CM | POA: Diagnosis not present

## 2014-04-03 DIAGNOSIS — I872 Venous insufficiency (chronic) (peripheral): Secondary | ICD-10-CM | POA: Diagnosis not present

## 2014-04-03 DIAGNOSIS — Z9861 Coronary angioplasty status: Secondary | ICD-10-CM | POA: Diagnosis not present

## 2014-04-03 DIAGNOSIS — D61818 Other pancytopenia: Secondary | ICD-10-CM | POA: Diagnosis not present

## 2014-04-03 DIAGNOSIS — I209 Angina pectoris, unspecified: Secondary | ICD-10-CM | POA: Diagnosis not present

## 2014-04-03 DIAGNOSIS — I119 Hypertensive heart disease without heart failure: Secondary | ICD-10-CM | POA: Diagnosis not present

## 2014-04-06 DIAGNOSIS — M1993 Secondary osteoarthritis, unspecified site: Secondary | ICD-10-CM | POA: Diagnosis not present

## 2014-04-06 DIAGNOSIS — I495 Sick sinus syndrome: Secondary | ICD-10-CM | POA: Diagnosis not present

## 2014-04-06 DIAGNOSIS — I25119 Atherosclerotic heart disease of native coronary artery with unspecified angina pectoris: Secondary | ICD-10-CM | POA: Diagnosis not present

## 2014-04-06 DIAGNOSIS — I10 Essential (primary) hypertension: Secondary | ICD-10-CM | POA: Diagnosis not present

## 2014-04-06 DIAGNOSIS — H409 Unspecified glaucoma: Secondary | ICD-10-CM | POA: Diagnosis not present

## 2014-04-06 DIAGNOSIS — E784 Other hyperlipidemia: Secondary | ICD-10-CM | POA: Diagnosis not present

## 2014-04-06 DIAGNOSIS — R64 Cachexia: Secondary | ICD-10-CM | POA: Diagnosis not present

## 2014-04-08 DIAGNOSIS — R079 Chest pain, unspecified: Secondary | ICD-10-CM | POA: Diagnosis not present

## 2014-05-07 DIAGNOSIS — R64 Cachexia: Secondary | ICD-10-CM | POA: Diagnosis not present

## 2014-05-07 DIAGNOSIS — I495 Sick sinus syndrome: Secondary | ICD-10-CM | POA: Diagnosis not present

## 2014-05-07 DIAGNOSIS — I10 Essential (primary) hypertension: Secondary | ICD-10-CM | POA: Diagnosis not present

## 2014-05-07 DIAGNOSIS — M1993 Secondary osteoarthritis, unspecified site: Secondary | ICD-10-CM | POA: Diagnosis not present

## 2014-05-07 DIAGNOSIS — I25119 Atherosclerotic heart disease of native coronary artery with unspecified angina pectoris: Secondary | ICD-10-CM | POA: Diagnosis not present

## 2014-05-07 DIAGNOSIS — E784 Other hyperlipidemia: Secondary | ICD-10-CM | POA: Diagnosis not present

## 2014-05-07 DIAGNOSIS — H409 Unspecified glaucoma: Secondary | ICD-10-CM | POA: Diagnosis not present

## 2014-06-06 DIAGNOSIS — I495 Sick sinus syndrome: Secondary | ICD-10-CM | POA: Diagnosis not present

## 2014-06-06 DIAGNOSIS — I25119 Atherosclerotic heart disease of native coronary artery with unspecified angina pectoris: Secondary | ICD-10-CM | POA: Diagnosis not present

## 2014-06-06 DIAGNOSIS — M1993 Secondary osteoarthritis, unspecified site: Secondary | ICD-10-CM | POA: Diagnosis not present

## 2014-06-06 DIAGNOSIS — I10 Essential (primary) hypertension: Secondary | ICD-10-CM | POA: Diagnosis not present

## 2014-06-06 DIAGNOSIS — R64 Cachexia: Secondary | ICD-10-CM | POA: Diagnosis not present

## 2014-06-06 DIAGNOSIS — E784 Other hyperlipidemia: Secondary | ICD-10-CM | POA: Diagnosis not present

## 2014-06-06 DIAGNOSIS — H409 Unspecified glaucoma: Secondary | ICD-10-CM | POA: Diagnosis not present

## 2014-07-07 DIAGNOSIS — M1993 Secondary osteoarthritis, unspecified site: Secondary | ICD-10-CM | POA: Diagnosis not present

## 2014-07-07 DIAGNOSIS — I10 Essential (primary) hypertension: Secondary | ICD-10-CM | POA: Diagnosis not present

## 2014-07-07 DIAGNOSIS — H409 Unspecified glaucoma: Secondary | ICD-10-CM | POA: Diagnosis not present

## 2014-07-07 DIAGNOSIS — I495 Sick sinus syndrome: Secondary | ICD-10-CM | POA: Diagnosis not present

## 2014-07-07 DIAGNOSIS — E784 Other hyperlipidemia: Secondary | ICD-10-CM | POA: Diagnosis not present

## 2014-07-07 DIAGNOSIS — R64 Cachexia: Secondary | ICD-10-CM | POA: Diagnosis not present

## 2014-07-07 DIAGNOSIS — I25119 Atherosclerotic heart disease of native coronary artery with unspecified angina pectoris: Secondary | ICD-10-CM | POA: Diagnosis not present

## 2014-07-12 DIAGNOSIS — E784 Other hyperlipidemia: Secondary | ICD-10-CM | POA: Diagnosis not present

## 2014-07-12 DIAGNOSIS — M1993 Secondary osteoarthritis, unspecified site: Secondary | ICD-10-CM | POA: Diagnosis not present

## 2014-07-12 DIAGNOSIS — I25119 Atherosclerotic heart disease of native coronary artery with unspecified angina pectoris: Secondary | ICD-10-CM | POA: Diagnosis not present

## 2014-07-12 DIAGNOSIS — I10 Essential (primary) hypertension: Secondary | ICD-10-CM | POA: Diagnosis not present

## 2014-07-12 DIAGNOSIS — I495 Sick sinus syndrome: Secondary | ICD-10-CM | POA: Diagnosis not present

## 2014-07-12 DIAGNOSIS — R64 Cachexia: Secondary | ICD-10-CM | POA: Diagnosis not present

## 2014-07-13 DIAGNOSIS — I208 Other forms of angina pectoris: Secondary | ICD-10-CM | POA: Diagnosis not present

## 2014-07-13 DIAGNOSIS — E785 Hyperlipidemia, unspecified: Secondary | ICD-10-CM | POA: Diagnosis not present

## 2014-07-13 DIAGNOSIS — I2510 Atherosclerotic heart disease of native coronary artery without angina pectoris: Secondary | ICD-10-CM | POA: Diagnosis not present

## 2014-07-19 DIAGNOSIS — I495 Sick sinus syndrome: Secondary | ICD-10-CM | POA: Diagnosis not present

## 2014-07-19 DIAGNOSIS — E784 Other hyperlipidemia: Secondary | ICD-10-CM | POA: Diagnosis not present

## 2014-07-19 DIAGNOSIS — M1993 Secondary osteoarthritis, unspecified site: Secondary | ICD-10-CM | POA: Diagnosis not present

## 2014-07-19 DIAGNOSIS — R64 Cachexia: Secondary | ICD-10-CM | POA: Diagnosis not present

## 2014-07-19 DIAGNOSIS — I10 Essential (primary) hypertension: Secondary | ICD-10-CM | POA: Diagnosis not present

## 2014-07-19 DIAGNOSIS — I25119 Atherosclerotic heart disease of native coronary artery with unspecified angina pectoris: Secondary | ICD-10-CM | POA: Diagnosis not present

## 2014-07-26 DIAGNOSIS — I25119 Atherosclerotic heart disease of native coronary artery with unspecified angina pectoris: Secondary | ICD-10-CM | POA: Diagnosis not present

## 2014-07-26 DIAGNOSIS — I495 Sick sinus syndrome: Secondary | ICD-10-CM | POA: Diagnosis not present

## 2014-07-26 DIAGNOSIS — I10 Essential (primary) hypertension: Secondary | ICD-10-CM | POA: Diagnosis not present

## 2014-07-26 DIAGNOSIS — M1993 Secondary osteoarthritis, unspecified site: Secondary | ICD-10-CM | POA: Diagnosis not present

## 2014-07-26 DIAGNOSIS — E784 Other hyperlipidemia: Secondary | ICD-10-CM | POA: Diagnosis not present

## 2014-07-26 DIAGNOSIS — R64 Cachexia: Secondary | ICD-10-CM | POA: Diagnosis not present

## 2014-08-02 DIAGNOSIS — M1993 Secondary osteoarthritis, unspecified site: Secondary | ICD-10-CM | POA: Diagnosis not present

## 2014-08-02 DIAGNOSIS — I25119 Atherosclerotic heart disease of native coronary artery with unspecified angina pectoris: Secondary | ICD-10-CM | POA: Diagnosis not present

## 2014-08-02 DIAGNOSIS — R64 Cachexia: Secondary | ICD-10-CM | POA: Diagnosis not present

## 2014-08-02 DIAGNOSIS — E784 Other hyperlipidemia: Secondary | ICD-10-CM | POA: Diagnosis not present

## 2014-08-02 DIAGNOSIS — I10 Essential (primary) hypertension: Secondary | ICD-10-CM | POA: Diagnosis not present

## 2014-08-02 DIAGNOSIS — I495 Sick sinus syndrome: Secondary | ICD-10-CM | POA: Diagnosis not present

## 2014-08-07 DIAGNOSIS — I10 Essential (primary) hypertension: Secondary | ICD-10-CM | POA: Diagnosis not present

## 2014-08-07 DIAGNOSIS — I25119 Atherosclerotic heart disease of native coronary artery with unspecified angina pectoris: Secondary | ICD-10-CM | POA: Diagnosis not present

## 2014-08-07 DIAGNOSIS — R64 Cachexia: Secondary | ICD-10-CM | POA: Diagnosis not present

## 2014-08-07 DIAGNOSIS — E784 Other hyperlipidemia: Secondary | ICD-10-CM | POA: Diagnosis not present

## 2014-08-07 DIAGNOSIS — M1993 Secondary osteoarthritis, unspecified site: Secondary | ICD-10-CM | POA: Diagnosis not present

## 2014-08-07 DIAGNOSIS — H409 Unspecified glaucoma: Secondary | ICD-10-CM | POA: Diagnosis not present

## 2014-08-07 DIAGNOSIS — I495 Sick sinus syndrome: Secondary | ICD-10-CM | POA: Diagnosis not present

## 2014-08-09 DIAGNOSIS — I495 Sick sinus syndrome: Secondary | ICD-10-CM | POA: Diagnosis not present

## 2014-08-09 DIAGNOSIS — M1993 Secondary osteoarthritis, unspecified site: Secondary | ICD-10-CM | POA: Diagnosis not present

## 2014-08-09 DIAGNOSIS — E784 Other hyperlipidemia: Secondary | ICD-10-CM | POA: Diagnosis not present

## 2014-08-09 DIAGNOSIS — I10 Essential (primary) hypertension: Secondary | ICD-10-CM | POA: Diagnosis not present

## 2014-08-09 DIAGNOSIS — I25119 Atherosclerotic heart disease of native coronary artery with unspecified angina pectoris: Secondary | ICD-10-CM | POA: Diagnosis not present

## 2014-08-09 DIAGNOSIS — R64 Cachexia: Secondary | ICD-10-CM | POA: Diagnosis not present

## 2014-08-16 DIAGNOSIS — E784 Other hyperlipidemia: Secondary | ICD-10-CM | POA: Diagnosis not present

## 2014-08-16 DIAGNOSIS — I495 Sick sinus syndrome: Secondary | ICD-10-CM | POA: Diagnosis not present

## 2014-08-16 DIAGNOSIS — R64 Cachexia: Secondary | ICD-10-CM | POA: Diagnosis not present

## 2014-08-16 DIAGNOSIS — I25119 Atherosclerotic heart disease of native coronary artery with unspecified angina pectoris: Secondary | ICD-10-CM | POA: Diagnosis not present

## 2014-08-16 DIAGNOSIS — I10 Essential (primary) hypertension: Secondary | ICD-10-CM | POA: Diagnosis not present

## 2014-08-16 DIAGNOSIS — M1993 Secondary osteoarthritis, unspecified site: Secondary | ICD-10-CM | POA: Diagnosis not present

## 2014-08-24 DIAGNOSIS — I10 Essential (primary) hypertension: Secondary | ICD-10-CM | POA: Diagnosis not present

## 2014-08-24 DIAGNOSIS — I495 Sick sinus syndrome: Secondary | ICD-10-CM | POA: Diagnosis not present

## 2014-08-24 DIAGNOSIS — M1993 Secondary osteoarthritis, unspecified site: Secondary | ICD-10-CM | POA: Diagnosis not present

## 2014-08-24 DIAGNOSIS — I25119 Atherosclerotic heart disease of native coronary artery with unspecified angina pectoris: Secondary | ICD-10-CM | POA: Diagnosis not present

## 2014-08-24 DIAGNOSIS — E784 Other hyperlipidemia: Secondary | ICD-10-CM | POA: Diagnosis not present

## 2014-08-24 DIAGNOSIS — R64 Cachexia: Secondary | ICD-10-CM | POA: Diagnosis not present

## 2014-08-30 DIAGNOSIS — I10 Essential (primary) hypertension: Secondary | ICD-10-CM | POA: Diagnosis not present

## 2014-08-30 DIAGNOSIS — R64 Cachexia: Secondary | ICD-10-CM | POA: Diagnosis not present

## 2014-08-30 DIAGNOSIS — E784 Other hyperlipidemia: Secondary | ICD-10-CM | POA: Diagnosis not present

## 2014-08-30 DIAGNOSIS — I25119 Atherosclerotic heart disease of native coronary artery with unspecified angina pectoris: Secondary | ICD-10-CM | POA: Diagnosis not present

## 2014-08-30 DIAGNOSIS — M1993 Secondary osteoarthritis, unspecified site: Secondary | ICD-10-CM | POA: Diagnosis not present

## 2014-08-30 DIAGNOSIS — I495 Sick sinus syndrome: Secondary | ICD-10-CM | POA: Diagnosis not present

## 2014-09-05 DIAGNOSIS — E784 Other hyperlipidemia: Secondary | ICD-10-CM | POA: Diagnosis not present

## 2014-09-05 DIAGNOSIS — I10 Essential (primary) hypertension: Secondary | ICD-10-CM | POA: Diagnosis not present

## 2014-09-05 DIAGNOSIS — M1993 Secondary osteoarthritis, unspecified site: Secondary | ICD-10-CM | POA: Diagnosis not present

## 2014-09-05 DIAGNOSIS — H409 Unspecified glaucoma: Secondary | ICD-10-CM | POA: Diagnosis not present

## 2014-09-05 DIAGNOSIS — I495 Sick sinus syndrome: Secondary | ICD-10-CM | POA: Diagnosis not present

## 2014-09-05 DIAGNOSIS — R64 Cachexia: Secondary | ICD-10-CM | POA: Diagnosis not present

## 2014-09-05 DIAGNOSIS — I25119 Atherosclerotic heart disease of native coronary artery with unspecified angina pectoris: Secondary | ICD-10-CM | POA: Diagnosis not present

## 2014-09-06 DIAGNOSIS — I10 Essential (primary) hypertension: Secondary | ICD-10-CM | POA: Diagnosis not present

## 2014-09-06 DIAGNOSIS — I495 Sick sinus syndrome: Secondary | ICD-10-CM | POA: Diagnosis not present

## 2014-09-06 DIAGNOSIS — M1993 Secondary osteoarthritis, unspecified site: Secondary | ICD-10-CM | POA: Diagnosis not present

## 2014-09-06 DIAGNOSIS — R64 Cachexia: Secondary | ICD-10-CM | POA: Diagnosis not present

## 2014-09-06 DIAGNOSIS — I25119 Atherosclerotic heart disease of native coronary artery with unspecified angina pectoris: Secondary | ICD-10-CM | POA: Diagnosis not present

## 2014-09-06 DIAGNOSIS — E784 Other hyperlipidemia: Secondary | ICD-10-CM | POA: Diagnosis not present

## 2014-09-13 DIAGNOSIS — I10 Essential (primary) hypertension: Secondary | ICD-10-CM | POA: Diagnosis not present

## 2014-09-13 DIAGNOSIS — I495 Sick sinus syndrome: Secondary | ICD-10-CM | POA: Diagnosis not present

## 2014-09-13 DIAGNOSIS — R64 Cachexia: Secondary | ICD-10-CM | POA: Diagnosis not present

## 2014-09-13 DIAGNOSIS — I25119 Atherosclerotic heart disease of native coronary artery with unspecified angina pectoris: Secondary | ICD-10-CM | POA: Diagnosis not present

## 2014-09-13 DIAGNOSIS — M1993 Secondary osteoarthritis, unspecified site: Secondary | ICD-10-CM | POA: Diagnosis not present

## 2014-09-13 DIAGNOSIS — E784 Other hyperlipidemia: Secondary | ICD-10-CM | POA: Diagnosis not present

## 2014-09-14 DIAGNOSIS — I25119 Atherosclerotic heart disease of native coronary artery with unspecified angina pectoris: Secondary | ICD-10-CM | POA: Diagnosis not present

## 2014-09-14 DIAGNOSIS — M1993 Secondary osteoarthritis, unspecified site: Secondary | ICD-10-CM | POA: Diagnosis not present

## 2014-09-14 DIAGNOSIS — E784 Other hyperlipidemia: Secondary | ICD-10-CM | POA: Diagnosis not present

## 2014-09-14 DIAGNOSIS — R64 Cachexia: Secondary | ICD-10-CM | POA: Diagnosis not present

## 2014-09-14 DIAGNOSIS — I495 Sick sinus syndrome: Secondary | ICD-10-CM | POA: Diagnosis not present

## 2014-09-14 DIAGNOSIS — I10 Essential (primary) hypertension: Secondary | ICD-10-CM | POA: Diagnosis not present

## 2014-09-21 DIAGNOSIS — M1993 Secondary osteoarthritis, unspecified site: Secondary | ICD-10-CM | POA: Diagnosis not present

## 2014-09-21 DIAGNOSIS — I25119 Atherosclerotic heart disease of native coronary artery with unspecified angina pectoris: Secondary | ICD-10-CM | POA: Diagnosis not present

## 2014-09-21 DIAGNOSIS — R64 Cachexia: Secondary | ICD-10-CM | POA: Diagnosis not present

## 2014-09-21 DIAGNOSIS — E784 Other hyperlipidemia: Secondary | ICD-10-CM | POA: Diagnosis not present

## 2014-09-21 DIAGNOSIS — I10 Essential (primary) hypertension: Secondary | ICD-10-CM | POA: Diagnosis not present

## 2014-09-21 DIAGNOSIS — I495 Sick sinus syndrome: Secondary | ICD-10-CM | POA: Diagnosis not present

## 2014-09-25 DIAGNOSIS — E785 Hyperlipidemia, unspecified: Secondary | ICD-10-CM | POA: Diagnosis not present

## 2014-09-25 DIAGNOSIS — I25119 Atherosclerotic heart disease of native coronary artery with unspecified angina pectoris: Secondary | ICD-10-CM | POA: Diagnosis not present

## 2014-09-25 DIAGNOSIS — I208 Other forms of angina pectoris: Secondary | ICD-10-CM | POA: Diagnosis not present

## 2014-09-25 DIAGNOSIS — I2510 Atherosclerotic heart disease of native coronary artery without angina pectoris: Secondary | ICD-10-CM | POA: Diagnosis not present

## 2014-09-26 DIAGNOSIS — M1993 Secondary osteoarthritis, unspecified site: Secondary | ICD-10-CM | POA: Diagnosis not present

## 2014-09-26 DIAGNOSIS — I10 Essential (primary) hypertension: Secondary | ICD-10-CM | POA: Diagnosis not present

## 2014-09-26 DIAGNOSIS — R64 Cachexia: Secondary | ICD-10-CM | POA: Diagnosis not present

## 2014-09-26 DIAGNOSIS — I25119 Atherosclerotic heart disease of native coronary artery with unspecified angina pectoris: Secondary | ICD-10-CM | POA: Diagnosis not present

## 2014-09-26 DIAGNOSIS — I495 Sick sinus syndrome: Secondary | ICD-10-CM | POA: Diagnosis not present

## 2014-09-26 DIAGNOSIS — E784 Other hyperlipidemia: Secondary | ICD-10-CM | POA: Diagnosis not present

## 2014-10-02 DIAGNOSIS — D61818 Other pancytopenia: Secondary | ICD-10-CM | POA: Diagnosis not present

## 2014-10-02 DIAGNOSIS — I208 Other forms of angina pectoris: Secondary | ICD-10-CM | POA: Diagnosis not present

## 2014-10-02 DIAGNOSIS — E785 Hyperlipidemia, unspecified: Secondary | ICD-10-CM | POA: Diagnosis not present

## 2014-10-02 DIAGNOSIS — I119 Hypertensive heart disease without heart failure: Secondary | ICD-10-CM | POA: Diagnosis not present

## 2014-10-02 DIAGNOSIS — R001 Bradycardia, unspecified: Secondary | ICD-10-CM | POA: Diagnosis not present

## 2014-10-02 DIAGNOSIS — Z9861 Coronary angioplasty status: Secondary | ICD-10-CM | POA: Diagnosis not present

## 2014-10-02 DIAGNOSIS — I2510 Atherosclerotic heart disease of native coronary artery without angina pectoris: Secondary | ICD-10-CM | POA: Diagnosis not present

## 2014-10-02 DIAGNOSIS — I872 Venous insufficiency (chronic) (peripheral): Secondary | ICD-10-CM | POA: Diagnosis not present

## 2014-10-03 DIAGNOSIS — E784 Other hyperlipidemia: Secondary | ICD-10-CM | POA: Diagnosis not present

## 2014-10-03 DIAGNOSIS — I25119 Atherosclerotic heart disease of native coronary artery with unspecified angina pectoris: Secondary | ICD-10-CM | POA: Diagnosis not present

## 2014-10-03 DIAGNOSIS — M1993 Secondary osteoarthritis, unspecified site: Secondary | ICD-10-CM | POA: Diagnosis not present

## 2014-10-03 DIAGNOSIS — R64 Cachexia: Secondary | ICD-10-CM | POA: Diagnosis not present

## 2014-10-03 DIAGNOSIS — I495 Sick sinus syndrome: Secondary | ICD-10-CM | POA: Diagnosis not present

## 2014-10-03 DIAGNOSIS — I10 Essential (primary) hypertension: Secondary | ICD-10-CM | POA: Diagnosis not present

## 2014-10-06 DIAGNOSIS — I25119 Atherosclerotic heart disease of native coronary artery with unspecified angina pectoris: Secondary | ICD-10-CM | POA: Diagnosis not present

## 2014-10-06 DIAGNOSIS — I495 Sick sinus syndrome: Secondary | ICD-10-CM | POA: Diagnosis not present

## 2014-10-06 DIAGNOSIS — H409 Unspecified glaucoma: Secondary | ICD-10-CM | POA: Diagnosis not present

## 2014-10-06 DIAGNOSIS — I10 Essential (primary) hypertension: Secondary | ICD-10-CM | POA: Diagnosis not present

## 2014-10-06 DIAGNOSIS — R64 Cachexia: Secondary | ICD-10-CM | POA: Diagnosis not present

## 2014-10-06 DIAGNOSIS — M1993 Secondary osteoarthritis, unspecified site: Secondary | ICD-10-CM | POA: Diagnosis not present

## 2014-10-06 DIAGNOSIS — E784 Other hyperlipidemia: Secondary | ICD-10-CM | POA: Diagnosis not present

## 2014-10-10 DIAGNOSIS — I495 Sick sinus syndrome: Secondary | ICD-10-CM | POA: Diagnosis not present

## 2014-10-10 DIAGNOSIS — E784 Other hyperlipidemia: Secondary | ICD-10-CM | POA: Diagnosis not present

## 2014-10-10 DIAGNOSIS — R64 Cachexia: Secondary | ICD-10-CM | POA: Diagnosis not present

## 2014-10-10 DIAGNOSIS — I10 Essential (primary) hypertension: Secondary | ICD-10-CM | POA: Diagnosis not present

## 2014-10-10 DIAGNOSIS — M1993 Secondary osteoarthritis, unspecified site: Secondary | ICD-10-CM | POA: Diagnosis not present

## 2014-10-10 DIAGNOSIS — I25119 Atherosclerotic heart disease of native coronary artery with unspecified angina pectoris: Secondary | ICD-10-CM | POA: Diagnosis not present

## 2014-10-19 DIAGNOSIS — I25119 Atherosclerotic heart disease of native coronary artery with unspecified angina pectoris: Secondary | ICD-10-CM | POA: Diagnosis not present

## 2014-10-19 DIAGNOSIS — I10 Essential (primary) hypertension: Secondary | ICD-10-CM | POA: Diagnosis not present

## 2014-10-19 DIAGNOSIS — R64 Cachexia: Secondary | ICD-10-CM | POA: Diagnosis not present

## 2014-10-19 DIAGNOSIS — M1993 Secondary osteoarthritis, unspecified site: Secondary | ICD-10-CM | POA: Diagnosis not present

## 2014-10-19 DIAGNOSIS — I495 Sick sinus syndrome: Secondary | ICD-10-CM | POA: Diagnosis not present

## 2014-10-19 DIAGNOSIS — E784 Other hyperlipidemia: Secondary | ICD-10-CM | POA: Diagnosis not present

## 2014-10-23 DIAGNOSIS — I25119 Atherosclerotic heart disease of native coronary artery with unspecified angina pectoris: Secondary | ICD-10-CM | POA: Diagnosis not present

## 2014-10-23 DIAGNOSIS — M1993 Secondary osteoarthritis, unspecified site: Secondary | ICD-10-CM | POA: Diagnosis not present

## 2014-10-23 DIAGNOSIS — I495 Sick sinus syndrome: Secondary | ICD-10-CM | POA: Diagnosis not present

## 2014-10-23 DIAGNOSIS — I10 Essential (primary) hypertension: Secondary | ICD-10-CM | POA: Diagnosis not present

## 2014-10-23 DIAGNOSIS — R64 Cachexia: Secondary | ICD-10-CM | POA: Diagnosis not present

## 2014-10-23 DIAGNOSIS — E784 Other hyperlipidemia: Secondary | ICD-10-CM | POA: Diagnosis not present

## 2014-10-24 DIAGNOSIS — E784 Other hyperlipidemia: Secondary | ICD-10-CM | POA: Diagnosis not present

## 2014-10-24 DIAGNOSIS — I25119 Atherosclerotic heart disease of native coronary artery with unspecified angina pectoris: Secondary | ICD-10-CM | POA: Diagnosis not present

## 2014-10-24 DIAGNOSIS — R64 Cachexia: Secondary | ICD-10-CM | POA: Diagnosis not present

## 2014-10-24 DIAGNOSIS — M1993 Secondary osteoarthritis, unspecified site: Secondary | ICD-10-CM | POA: Diagnosis not present

## 2014-10-24 DIAGNOSIS — I10 Essential (primary) hypertension: Secondary | ICD-10-CM | POA: Diagnosis not present

## 2014-10-24 DIAGNOSIS — I495 Sick sinus syndrome: Secondary | ICD-10-CM | POA: Diagnosis not present

## 2014-11-02 DIAGNOSIS — M1993 Secondary osteoarthritis, unspecified site: Secondary | ICD-10-CM | POA: Diagnosis not present

## 2014-11-02 DIAGNOSIS — R64 Cachexia: Secondary | ICD-10-CM | POA: Diagnosis not present

## 2014-11-02 DIAGNOSIS — I25119 Atherosclerotic heart disease of native coronary artery with unspecified angina pectoris: Secondary | ICD-10-CM | POA: Diagnosis not present

## 2014-11-02 DIAGNOSIS — E784 Other hyperlipidemia: Secondary | ICD-10-CM | POA: Diagnosis not present

## 2014-11-02 DIAGNOSIS — I495 Sick sinus syndrome: Secondary | ICD-10-CM | POA: Diagnosis not present

## 2014-11-02 DIAGNOSIS — I10 Essential (primary) hypertension: Secondary | ICD-10-CM | POA: Diagnosis not present

## 2014-11-05 DIAGNOSIS — M1993 Secondary osteoarthritis, unspecified site: Secondary | ICD-10-CM | POA: Diagnosis not present

## 2014-11-05 DIAGNOSIS — I10 Essential (primary) hypertension: Secondary | ICD-10-CM | POA: Diagnosis not present

## 2014-11-05 DIAGNOSIS — R64 Cachexia: Secondary | ICD-10-CM | POA: Diagnosis not present

## 2014-11-05 DIAGNOSIS — I495 Sick sinus syndrome: Secondary | ICD-10-CM | POA: Diagnosis not present

## 2014-11-05 DIAGNOSIS — E784 Other hyperlipidemia: Secondary | ICD-10-CM | POA: Diagnosis not present

## 2014-11-05 DIAGNOSIS — I25119 Atherosclerotic heart disease of native coronary artery with unspecified angina pectoris: Secondary | ICD-10-CM | POA: Diagnosis not present

## 2014-11-05 DIAGNOSIS — H409 Unspecified glaucoma: Secondary | ICD-10-CM | POA: Diagnosis not present

## 2014-11-06 DIAGNOSIS — I495 Sick sinus syndrome: Secondary | ICD-10-CM | POA: Diagnosis not present

## 2014-11-06 DIAGNOSIS — E784 Other hyperlipidemia: Secondary | ICD-10-CM | POA: Diagnosis not present

## 2014-11-06 DIAGNOSIS — I25119 Atherosclerotic heart disease of native coronary artery with unspecified angina pectoris: Secondary | ICD-10-CM | POA: Diagnosis not present

## 2014-11-06 DIAGNOSIS — M1993 Secondary osteoarthritis, unspecified site: Secondary | ICD-10-CM | POA: Diagnosis not present

## 2014-11-06 DIAGNOSIS — R64 Cachexia: Secondary | ICD-10-CM | POA: Diagnosis not present

## 2014-11-06 DIAGNOSIS — I10 Essential (primary) hypertension: Secondary | ICD-10-CM | POA: Diagnosis not present

## 2014-11-14 DIAGNOSIS — R64 Cachexia: Secondary | ICD-10-CM | POA: Diagnosis not present

## 2014-11-14 DIAGNOSIS — I495 Sick sinus syndrome: Secondary | ICD-10-CM | POA: Diagnosis not present

## 2014-11-14 DIAGNOSIS — M1993 Secondary osteoarthritis, unspecified site: Secondary | ICD-10-CM | POA: Diagnosis not present

## 2014-11-14 DIAGNOSIS — E784 Other hyperlipidemia: Secondary | ICD-10-CM | POA: Diagnosis not present

## 2014-11-14 DIAGNOSIS — I10 Essential (primary) hypertension: Secondary | ICD-10-CM | POA: Diagnosis not present

## 2014-11-14 DIAGNOSIS — I25119 Atherosclerotic heart disease of native coronary artery with unspecified angina pectoris: Secondary | ICD-10-CM | POA: Diagnosis not present

## 2014-11-16 DIAGNOSIS — R001 Bradycardia, unspecified: Secondary | ICD-10-CM | POA: Diagnosis not present

## 2014-11-16 DIAGNOSIS — I208 Other forms of angina pectoris: Secondary | ICD-10-CM | POA: Diagnosis not present

## 2014-11-16 DIAGNOSIS — I119 Hypertensive heart disease without heart failure: Secondary | ICD-10-CM | POA: Diagnosis not present

## 2014-11-16 DIAGNOSIS — I2510 Atherosclerotic heart disease of native coronary artery without angina pectoris: Secondary | ICD-10-CM | POA: Diagnosis not present

## 2014-11-21 DIAGNOSIS — M1993 Secondary osteoarthritis, unspecified site: Secondary | ICD-10-CM | POA: Diagnosis not present

## 2014-11-21 DIAGNOSIS — I495 Sick sinus syndrome: Secondary | ICD-10-CM | POA: Diagnosis not present

## 2014-11-21 DIAGNOSIS — E784 Other hyperlipidemia: Secondary | ICD-10-CM | POA: Diagnosis not present

## 2014-11-21 DIAGNOSIS — R64 Cachexia: Secondary | ICD-10-CM | POA: Diagnosis not present

## 2014-11-21 DIAGNOSIS — I25119 Atherosclerotic heart disease of native coronary artery with unspecified angina pectoris: Secondary | ICD-10-CM | POA: Diagnosis not present

## 2014-11-21 DIAGNOSIS — I10 Essential (primary) hypertension: Secondary | ICD-10-CM | POA: Diagnosis not present

## 2014-12-01 DIAGNOSIS — I10 Essential (primary) hypertension: Secondary | ICD-10-CM | POA: Diagnosis not present

## 2014-12-01 DIAGNOSIS — R64 Cachexia: Secondary | ICD-10-CM | POA: Diagnosis not present

## 2014-12-01 DIAGNOSIS — I25119 Atherosclerotic heart disease of native coronary artery with unspecified angina pectoris: Secondary | ICD-10-CM | POA: Diagnosis not present

## 2014-12-01 DIAGNOSIS — I495 Sick sinus syndrome: Secondary | ICD-10-CM | POA: Diagnosis not present

## 2014-12-01 DIAGNOSIS — E784 Other hyperlipidemia: Secondary | ICD-10-CM | POA: Diagnosis not present

## 2014-12-01 DIAGNOSIS — M1993 Secondary osteoarthritis, unspecified site: Secondary | ICD-10-CM | POA: Diagnosis not present

## 2014-12-06 DIAGNOSIS — H409 Unspecified glaucoma: Secondary | ICD-10-CM | POA: Diagnosis not present

## 2014-12-06 DIAGNOSIS — R64 Cachexia: Secondary | ICD-10-CM | POA: Diagnosis not present

## 2014-12-06 DIAGNOSIS — I10 Essential (primary) hypertension: Secondary | ICD-10-CM | POA: Diagnosis not present

## 2014-12-06 DIAGNOSIS — M1993 Secondary osteoarthritis, unspecified site: Secondary | ICD-10-CM | POA: Diagnosis not present

## 2014-12-06 DIAGNOSIS — I25119 Atherosclerotic heart disease of native coronary artery with unspecified angina pectoris: Secondary | ICD-10-CM | POA: Diagnosis not present

## 2014-12-06 DIAGNOSIS — I495 Sick sinus syndrome: Secondary | ICD-10-CM | POA: Diagnosis not present

## 2014-12-06 DIAGNOSIS — E784 Other hyperlipidemia: Secondary | ICD-10-CM | POA: Diagnosis not present

## 2014-12-07 DIAGNOSIS — E784 Other hyperlipidemia: Secondary | ICD-10-CM | POA: Diagnosis not present

## 2014-12-07 DIAGNOSIS — R64 Cachexia: Secondary | ICD-10-CM | POA: Diagnosis not present

## 2014-12-07 DIAGNOSIS — I10 Essential (primary) hypertension: Secondary | ICD-10-CM | POA: Diagnosis not present

## 2014-12-07 DIAGNOSIS — I25119 Atherosclerotic heart disease of native coronary artery with unspecified angina pectoris: Secondary | ICD-10-CM | POA: Diagnosis not present

## 2014-12-07 DIAGNOSIS — I495 Sick sinus syndrome: Secondary | ICD-10-CM | POA: Diagnosis not present

## 2014-12-07 DIAGNOSIS — M1993 Secondary osteoarthritis, unspecified site: Secondary | ICD-10-CM | POA: Diagnosis not present

## 2014-12-12 DIAGNOSIS — I495 Sick sinus syndrome: Secondary | ICD-10-CM | POA: Diagnosis not present

## 2014-12-12 DIAGNOSIS — I10 Essential (primary) hypertension: Secondary | ICD-10-CM | POA: Diagnosis not present

## 2014-12-12 DIAGNOSIS — I25119 Atherosclerotic heart disease of native coronary artery with unspecified angina pectoris: Secondary | ICD-10-CM | POA: Diagnosis not present

## 2014-12-12 DIAGNOSIS — M1993 Secondary osteoarthritis, unspecified site: Secondary | ICD-10-CM | POA: Diagnosis not present

## 2014-12-12 DIAGNOSIS — R64 Cachexia: Secondary | ICD-10-CM | POA: Diagnosis not present

## 2014-12-12 DIAGNOSIS — E784 Other hyperlipidemia: Secondary | ICD-10-CM | POA: Diagnosis not present

## 2014-12-19 DIAGNOSIS — I495 Sick sinus syndrome: Secondary | ICD-10-CM | POA: Diagnosis not present

## 2014-12-19 DIAGNOSIS — E784 Other hyperlipidemia: Secondary | ICD-10-CM | POA: Diagnosis not present

## 2014-12-19 DIAGNOSIS — R64 Cachexia: Secondary | ICD-10-CM | POA: Diagnosis not present

## 2014-12-19 DIAGNOSIS — I10 Essential (primary) hypertension: Secondary | ICD-10-CM | POA: Diagnosis not present

## 2014-12-19 DIAGNOSIS — M1993 Secondary osteoarthritis, unspecified site: Secondary | ICD-10-CM | POA: Diagnosis not present

## 2014-12-19 DIAGNOSIS — I25119 Atherosclerotic heart disease of native coronary artery with unspecified angina pectoris: Secondary | ICD-10-CM | POA: Diagnosis not present

## 2014-12-28 DIAGNOSIS — I10 Essential (primary) hypertension: Secondary | ICD-10-CM | POA: Diagnosis not present

## 2014-12-28 DIAGNOSIS — I25119 Atherosclerotic heart disease of native coronary artery with unspecified angina pectoris: Secondary | ICD-10-CM | POA: Diagnosis not present

## 2014-12-28 DIAGNOSIS — R64 Cachexia: Secondary | ICD-10-CM | POA: Diagnosis not present

## 2014-12-28 DIAGNOSIS — M1993 Secondary osteoarthritis, unspecified site: Secondary | ICD-10-CM | POA: Diagnosis not present

## 2014-12-28 DIAGNOSIS — I495 Sick sinus syndrome: Secondary | ICD-10-CM | POA: Diagnosis not present

## 2014-12-28 DIAGNOSIS — E784 Other hyperlipidemia: Secondary | ICD-10-CM | POA: Diagnosis not present

## 2015-01-03 DIAGNOSIS — I10 Essential (primary) hypertension: Secondary | ICD-10-CM | POA: Diagnosis not present

## 2015-01-03 DIAGNOSIS — R64 Cachexia: Secondary | ICD-10-CM | POA: Diagnosis not present

## 2015-01-03 DIAGNOSIS — I495 Sick sinus syndrome: Secondary | ICD-10-CM | POA: Diagnosis not present

## 2015-01-03 DIAGNOSIS — I25119 Atherosclerotic heart disease of native coronary artery with unspecified angina pectoris: Secondary | ICD-10-CM | POA: Diagnosis not present

## 2015-01-03 DIAGNOSIS — M1993 Secondary osteoarthritis, unspecified site: Secondary | ICD-10-CM | POA: Diagnosis not present

## 2015-01-03 DIAGNOSIS — E784 Other hyperlipidemia: Secondary | ICD-10-CM | POA: Diagnosis not present

## 2015-01-05 DIAGNOSIS — H409 Unspecified glaucoma: Secondary | ICD-10-CM | POA: Diagnosis not present

## 2015-01-05 DIAGNOSIS — I25119 Atherosclerotic heart disease of native coronary artery with unspecified angina pectoris: Secondary | ICD-10-CM | POA: Diagnosis not present

## 2015-01-05 DIAGNOSIS — I495 Sick sinus syndrome: Secondary | ICD-10-CM | POA: Diagnosis not present

## 2015-01-05 DIAGNOSIS — M1993 Secondary osteoarthritis, unspecified site: Secondary | ICD-10-CM | POA: Diagnosis not present

## 2015-01-05 DIAGNOSIS — E784 Other hyperlipidemia: Secondary | ICD-10-CM | POA: Diagnosis not present

## 2015-01-05 DIAGNOSIS — I10 Essential (primary) hypertension: Secondary | ICD-10-CM | POA: Diagnosis not present

## 2015-01-05 DIAGNOSIS — R64 Cachexia: Secondary | ICD-10-CM | POA: Diagnosis not present

## 2015-01-11 DIAGNOSIS — I25119 Atherosclerotic heart disease of native coronary artery with unspecified angina pectoris: Secondary | ICD-10-CM | POA: Diagnosis not present

## 2015-01-11 DIAGNOSIS — I10 Essential (primary) hypertension: Secondary | ICD-10-CM | POA: Diagnosis not present

## 2015-01-11 DIAGNOSIS — E784 Other hyperlipidemia: Secondary | ICD-10-CM | POA: Diagnosis not present

## 2015-01-11 DIAGNOSIS — R64 Cachexia: Secondary | ICD-10-CM | POA: Diagnosis not present

## 2015-01-11 DIAGNOSIS — M1993 Secondary osteoarthritis, unspecified site: Secondary | ICD-10-CM | POA: Diagnosis not present

## 2015-01-11 DIAGNOSIS — I495 Sick sinus syndrome: Secondary | ICD-10-CM | POA: Diagnosis not present

## 2015-01-16 DIAGNOSIS — I495 Sick sinus syndrome: Secondary | ICD-10-CM | POA: Diagnosis not present

## 2015-01-16 DIAGNOSIS — M1993 Secondary osteoarthritis, unspecified site: Secondary | ICD-10-CM | POA: Diagnosis not present

## 2015-01-16 DIAGNOSIS — R64 Cachexia: Secondary | ICD-10-CM | POA: Diagnosis not present

## 2015-01-16 DIAGNOSIS — I10 Essential (primary) hypertension: Secondary | ICD-10-CM | POA: Diagnosis not present

## 2015-01-16 DIAGNOSIS — E784 Other hyperlipidemia: Secondary | ICD-10-CM | POA: Diagnosis not present

## 2015-01-16 DIAGNOSIS — I25119 Atherosclerotic heart disease of native coronary artery with unspecified angina pectoris: Secondary | ICD-10-CM | POA: Diagnosis not present

## 2015-01-22 DIAGNOSIS — I495 Sick sinus syndrome: Secondary | ICD-10-CM | POA: Diagnosis not present

## 2015-01-22 DIAGNOSIS — M1993 Secondary osteoarthritis, unspecified site: Secondary | ICD-10-CM | POA: Diagnosis not present

## 2015-01-22 DIAGNOSIS — I25119 Atherosclerotic heart disease of native coronary artery with unspecified angina pectoris: Secondary | ICD-10-CM | POA: Diagnosis not present

## 2015-01-22 DIAGNOSIS — R64 Cachexia: Secondary | ICD-10-CM | POA: Diagnosis not present

## 2015-01-22 DIAGNOSIS — I10 Essential (primary) hypertension: Secondary | ICD-10-CM | POA: Diagnosis not present

## 2015-01-22 DIAGNOSIS — E784 Other hyperlipidemia: Secondary | ICD-10-CM | POA: Diagnosis not present

## 2015-01-23 DIAGNOSIS — M1993 Secondary osteoarthritis, unspecified site: Secondary | ICD-10-CM | POA: Diagnosis not present

## 2015-01-23 DIAGNOSIS — I495 Sick sinus syndrome: Secondary | ICD-10-CM | POA: Diagnosis not present

## 2015-01-23 DIAGNOSIS — I25119 Atherosclerotic heart disease of native coronary artery with unspecified angina pectoris: Secondary | ICD-10-CM | POA: Diagnosis not present

## 2015-01-23 DIAGNOSIS — E784 Other hyperlipidemia: Secondary | ICD-10-CM | POA: Diagnosis not present

## 2015-01-23 DIAGNOSIS — I10 Essential (primary) hypertension: Secondary | ICD-10-CM | POA: Diagnosis not present

## 2015-01-23 DIAGNOSIS — R64 Cachexia: Secondary | ICD-10-CM | POA: Diagnosis not present

## 2015-01-30 DIAGNOSIS — I25119 Atherosclerotic heart disease of native coronary artery with unspecified angina pectoris: Secondary | ICD-10-CM | POA: Diagnosis not present

## 2015-01-30 DIAGNOSIS — E784 Other hyperlipidemia: Secondary | ICD-10-CM | POA: Diagnosis not present

## 2015-01-30 DIAGNOSIS — I495 Sick sinus syndrome: Secondary | ICD-10-CM | POA: Diagnosis not present

## 2015-01-30 DIAGNOSIS — M1993 Secondary osteoarthritis, unspecified site: Secondary | ICD-10-CM | POA: Diagnosis not present

## 2015-01-30 DIAGNOSIS — I10 Essential (primary) hypertension: Secondary | ICD-10-CM | POA: Diagnosis not present

## 2015-01-30 DIAGNOSIS — R64 Cachexia: Secondary | ICD-10-CM | POA: Diagnosis not present

## 2015-02-05 DIAGNOSIS — I10 Essential (primary) hypertension: Secondary | ICD-10-CM | POA: Diagnosis not present

## 2015-02-05 DIAGNOSIS — I25119 Atherosclerotic heart disease of native coronary artery with unspecified angina pectoris: Secondary | ICD-10-CM | POA: Diagnosis not present

## 2015-02-05 DIAGNOSIS — H409 Unspecified glaucoma: Secondary | ICD-10-CM | POA: Diagnosis not present

## 2015-02-05 DIAGNOSIS — R64 Cachexia: Secondary | ICD-10-CM | POA: Diagnosis not present

## 2015-02-05 DIAGNOSIS — I495 Sick sinus syndrome: Secondary | ICD-10-CM | POA: Diagnosis not present

## 2015-02-05 DIAGNOSIS — E784 Other hyperlipidemia: Secondary | ICD-10-CM | POA: Diagnosis not present

## 2015-02-05 DIAGNOSIS — M1993 Secondary osteoarthritis, unspecified site: Secondary | ICD-10-CM | POA: Diagnosis not present

## 2015-02-06 DIAGNOSIS — I10 Essential (primary) hypertension: Secondary | ICD-10-CM | POA: Diagnosis not present

## 2015-02-06 DIAGNOSIS — I495 Sick sinus syndrome: Secondary | ICD-10-CM | POA: Diagnosis not present

## 2015-02-06 DIAGNOSIS — I25119 Atherosclerotic heart disease of native coronary artery with unspecified angina pectoris: Secondary | ICD-10-CM | POA: Diagnosis not present

## 2015-02-06 DIAGNOSIS — M1993 Secondary osteoarthritis, unspecified site: Secondary | ICD-10-CM | POA: Diagnosis not present

## 2015-02-06 DIAGNOSIS — E784 Other hyperlipidemia: Secondary | ICD-10-CM | POA: Diagnosis not present

## 2015-02-06 DIAGNOSIS — R64 Cachexia: Secondary | ICD-10-CM | POA: Diagnosis not present

## 2015-02-15 DIAGNOSIS — M1993 Secondary osteoarthritis, unspecified site: Secondary | ICD-10-CM | POA: Diagnosis not present

## 2015-02-15 DIAGNOSIS — E784 Other hyperlipidemia: Secondary | ICD-10-CM | POA: Diagnosis not present

## 2015-02-15 DIAGNOSIS — I495 Sick sinus syndrome: Secondary | ICD-10-CM | POA: Diagnosis not present

## 2015-02-15 DIAGNOSIS — I25119 Atherosclerotic heart disease of native coronary artery with unspecified angina pectoris: Secondary | ICD-10-CM | POA: Diagnosis not present

## 2015-02-15 DIAGNOSIS — R64 Cachexia: Secondary | ICD-10-CM | POA: Diagnosis not present

## 2015-02-15 DIAGNOSIS — I10 Essential (primary) hypertension: Secondary | ICD-10-CM | POA: Diagnosis not present

## 2015-02-19 DIAGNOSIS — M1993 Secondary osteoarthritis, unspecified site: Secondary | ICD-10-CM | POA: Diagnosis not present

## 2015-02-19 DIAGNOSIS — I495 Sick sinus syndrome: Secondary | ICD-10-CM | POA: Diagnosis not present

## 2015-02-19 DIAGNOSIS — E784 Other hyperlipidemia: Secondary | ICD-10-CM | POA: Diagnosis not present

## 2015-02-19 DIAGNOSIS — I10 Essential (primary) hypertension: Secondary | ICD-10-CM | POA: Diagnosis not present

## 2015-02-19 DIAGNOSIS — R64 Cachexia: Secondary | ICD-10-CM | POA: Diagnosis not present

## 2015-02-19 DIAGNOSIS — I25119 Atherosclerotic heart disease of native coronary artery with unspecified angina pectoris: Secondary | ICD-10-CM | POA: Diagnosis not present

## 2015-02-20 DIAGNOSIS — R64 Cachexia: Secondary | ICD-10-CM | POA: Diagnosis not present

## 2015-02-20 DIAGNOSIS — I25119 Atherosclerotic heart disease of native coronary artery with unspecified angina pectoris: Secondary | ICD-10-CM | POA: Diagnosis not present

## 2015-02-20 DIAGNOSIS — I495 Sick sinus syndrome: Secondary | ICD-10-CM | POA: Diagnosis not present

## 2015-02-20 DIAGNOSIS — M1993 Secondary osteoarthritis, unspecified site: Secondary | ICD-10-CM | POA: Diagnosis not present

## 2015-02-20 DIAGNOSIS — E784 Other hyperlipidemia: Secondary | ICD-10-CM | POA: Diagnosis not present

## 2015-02-20 DIAGNOSIS — I10 Essential (primary) hypertension: Secondary | ICD-10-CM | POA: Diagnosis not present

## 2015-02-26 DIAGNOSIS — I495 Sick sinus syndrome: Secondary | ICD-10-CM | POA: Diagnosis not present

## 2015-02-26 DIAGNOSIS — E784 Other hyperlipidemia: Secondary | ICD-10-CM | POA: Diagnosis not present

## 2015-02-26 DIAGNOSIS — M1993 Secondary osteoarthritis, unspecified site: Secondary | ICD-10-CM | POA: Diagnosis not present

## 2015-02-26 DIAGNOSIS — R64 Cachexia: Secondary | ICD-10-CM | POA: Diagnosis not present

## 2015-02-26 DIAGNOSIS — I25119 Atherosclerotic heart disease of native coronary artery with unspecified angina pectoris: Secondary | ICD-10-CM | POA: Diagnosis not present

## 2015-02-26 DIAGNOSIS — I10 Essential (primary) hypertension: Secondary | ICD-10-CM | POA: Diagnosis not present

## 2015-03-08 DIAGNOSIS — M1993 Secondary osteoarthritis, unspecified site: Secondary | ICD-10-CM | POA: Diagnosis not present

## 2015-03-08 DIAGNOSIS — I10 Essential (primary) hypertension: Secondary | ICD-10-CM | POA: Diagnosis not present

## 2015-03-08 DIAGNOSIS — I25119 Atherosclerotic heart disease of native coronary artery with unspecified angina pectoris: Secondary | ICD-10-CM | POA: Diagnosis not present

## 2015-03-08 DIAGNOSIS — R64 Cachexia: Secondary | ICD-10-CM | POA: Diagnosis not present

## 2015-03-08 DIAGNOSIS — H409 Unspecified glaucoma: Secondary | ICD-10-CM | POA: Diagnosis not present

## 2015-03-08 DIAGNOSIS — E784 Other hyperlipidemia: Secondary | ICD-10-CM | POA: Diagnosis not present

## 2015-03-08 DIAGNOSIS — I495 Sick sinus syndrome: Secondary | ICD-10-CM | POA: Diagnosis not present

## 2015-03-15 DIAGNOSIS — E784 Other hyperlipidemia: Secondary | ICD-10-CM | POA: Diagnosis not present

## 2015-03-15 DIAGNOSIS — I25119 Atherosclerotic heart disease of native coronary artery with unspecified angina pectoris: Secondary | ICD-10-CM | POA: Diagnosis not present

## 2015-03-15 DIAGNOSIS — I10 Essential (primary) hypertension: Secondary | ICD-10-CM | POA: Diagnosis not present

## 2015-03-15 DIAGNOSIS — I495 Sick sinus syndrome: Secondary | ICD-10-CM | POA: Diagnosis not present

## 2015-03-15 DIAGNOSIS — R64 Cachexia: Secondary | ICD-10-CM | POA: Diagnosis not present

## 2015-03-15 DIAGNOSIS — M1993 Secondary osteoarthritis, unspecified site: Secondary | ICD-10-CM | POA: Diagnosis not present

## 2015-03-21 DIAGNOSIS — M1993 Secondary osteoarthritis, unspecified site: Secondary | ICD-10-CM | POA: Diagnosis not present

## 2015-03-21 DIAGNOSIS — I495 Sick sinus syndrome: Secondary | ICD-10-CM | POA: Diagnosis not present

## 2015-03-21 DIAGNOSIS — R64 Cachexia: Secondary | ICD-10-CM | POA: Diagnosis not present

## 2015-03-21 DIAGNOSIS — E784 Other hyperlipidemia: Secondary | ICD-10-CM | POA: Diagnosis not present

## 2015-03-21 DIAGNOSIS — I10 Essential (primary) hypertension: Secondary | ICD-10-CM | POA: Diagnosis not present

## 2015-03-21 DIAGNOSIS — I25119 Atherosclerotic heart disease of native coronary artery with unspecified angina pectoris: Secondary | ICD-10-CM | POA: Diagnosis not present

## 2015-03-22 DIAGNOSIS — I25119 Atherosclerotic heart disease of native coronary artery with unspecified angina pectoris: Secondary | ICD-10-CM | POA: Diagnosis not present

## 2015-03-26 DIAGNOSIS — R64 Cachexia: Secondary | ICD-10-CM | POA: Diagnosis not present

## 2015-03-26 DIAGNOSIS — I495 Sick sinus syndrome: Secondary | ICD-10-CM | POA: Diagnosis not present

## 2015-03-26 DIAGNOSIS — I10 Essential (primary) hypertension: Secondary | ICD-10-CM | POA: Diagnosis not present

## 2015-03-26 DIAGNOSIS — M1993 Secondary osteoarthritis, unspecified site: Secondary | ICD-10-CM | POA: Diagnosis not present

## 2015-03-26 DIAGNOSIS — E784 Other hyperlipidemia: Secondary | ICD-10-CM | POA: Diagnosis not present

## 2015-03-26 DIAGNOSIS — I25119 Atherosclerotic heart disease of native coronary artery with unspecified angina pectoris: Secondary | ICD-10-CM | POA: Diagnosis not present

## 2015-04-02 DIAGNOSIS — D61818 Other pancytopenia: Secondary | ICD-10-CM | POA: Diagnosis not present

## 2015-04-02 DIAGNOSIS — I872 Venous insufficiency (chronic) (peripheral): Secondary | ICD-10-CM | POA: Diagnosis not present

## 2015-04-02 DIAGNOSIS — E785 Hyperlipidemia, unspecified: Secondary | ICD-10-CM | POA: Diagnosis not present

## 2015-04-02 DIAGNOSIS — Z9861 Coronary angioplasty status: Secondary | ICD-10-CM | POA: Diagnosis not present

## 2015-04-02 DIAGNOSIS — I208 Other forms of angina pectoris: Secondary | ICD-10-CM | POA: Diagnosis not present

## 2015-04-02 DIAGNOSIS — R001 Bradycardia, unspecified: Secondary | ICD-10-CM | POA: Diagnosis not present

## 2015-04-02 DIAGNOSIS — I25119 Atherosclerotic heart disease of native coronary artery with unspecified angina pectoris: Secondary | ICD-10-CM | POA: Diagnosis not present

## 2015-04-02 DIAGNOSIS — I119 Hypertensive heart disease without heart failure: Secondary | ICD-10-CM | POA: Diagnosis not present

## 2015-04-03 DIAGNOSIS — I10 Essential (primary) hypertension: Secondary | ICD-10-CM | POA: Diagnosis not present

## 2015-04-03 DIAGNOSIS — M1993 Secondary osteoarthritis, unspecified site: Secondary | ICD-10-CM | POA: Diagnosis not present

## 2015-04-03 DIAGNOSIS — I25119 Atherosclerotic heart disease of native coronary artery with unspecified angina pectoris: Secondary | ICD-10-CM | POA: Diagnosis not present

## 2015-04-03 DIAGNOSIS — E784 Other hyperlipidemia: Secondary | ICD-10-CM | POA: Diagnosis not present

## 2015-04-03 DIAGNOSIS — I495 Sick sinus syndrome: Secondary | ICD-10-CM | POA: Diagnosis not present

## 2015-04-03 DIAGNOSIS — R64 Cachexia: Secondary | ICD-10-CM | POA: Diagnosis not present

## 2015-04-07 DIAGNOSIS — H409 Unspecified glaucoma: Secondary | ICD-10-CM | POA: Diagnosis not present

## 2015-04-07 DIAGNOSIS — I10 Essential (primary) hypertension: Secondary | ICD-10-CM | POA: Diagnosis not present

## 2015-04-07 DIAGNOSIS — M1993 Secondary osteoarthritis, unspecified site: Secondary | ICD-10-CM | POA: Diagnosis not present

## 2015-04-07 DIAGNOSIS — I25119 Atherosclerotic heart disease of native coronary artery with unspecified angina pectoris: Secondary | ICD-10-CM | POA: Diagnosis not present

## 2015-04-07 DIAGNOSIS — R64 Cachexia: Secondary | ICD-10-CM | POA: Diagnosis not present

## 2015-04-07 DIAGNOSIS — E784 Other hyperlipidemia: Secondary | ICD-10-CM | POA: Diagnosis not present

## 2015-04-07 DIAGNOSIS — I495 Sick sinus syndrome: Secondary | ICD-10-CM | POA: Diagnosis not present

## 2015-04-10 DIAGNOSIS — I495 Sick sinus syndrome: Secondary | ICD-10-CM | POA: Diagnosis not present

## 2015-04-10 DIAGNOSIS — E784 Other hyperlipidemia: Secondary | ICD-10-CM | POA: Diagnosis not present

## 2015-04-10 DIAGNOSIS — R64 Cachexia: Secondary | ICD-10-CM | POA: Diagnosis not present

## 2015-04-10 DIAGNOSIS — I25119 Atherosclerotic heart disease of native coronary artery with unspecified angina pectoris: Secondary | ICD-10-CM | POA: Diagnosis not present

## 2015-04-10 DIAGNOSIS — I10 Essential (primary) hypertension: Secondary | ICD-10-CM | POA: Diagnosis not present

## 2015-04-10 DIAGNOSIS — M1993 Secondary osteoarthritis, unspecified site: Secondary | ICD-10-CM | POA: Diagnosis not present

## 2015-04-11 DIAGNOSIS — I119 Hypertensive heart disease without heart failure: Secondary | ICD-10-CM | POA: Diagnosis not present

## 2015-04-11 DIAGNOSIS — E785 Hyperlipidemia, unspecified: Secondary | ICD-10-CM | POA: Diagnosis not present

## 2015-04-11 DIAGNOSIS — I25119 Atherosclerotic heart disease of native coronary artery with unspecified angina pectoris: Secondary | ICD-10-CM | POA: Diagnosis not present

## 2015-04-11 DIAGNOSIS — I208 Other forms of angina pectoris: Secondary | ICD-10-CM | POA: Diagnosis not present

## 2015-04-11 DIAGNOSIS — Z9861 Coronary angioplasty status: Secondary | ICD-10-CM | POA: Diagnosis not present

## 2015-04-11 DIAGNOSIS — I872 Venous insufficiency (chronic) (peripheral): Secondary | ICD-10-CM | POA: Diagnosis not present

## 2015-04-17 DIAGNOSIS — M1993 Secondary osteoarthritis, unspecified site: Secondary | ICD-10-CM | POA: Diagnosis not present

## 2015-04-17 DIAGNOSIS — I10 Essential (primary) hypertension: Secondary | ICD-10-CM | POA: Diagnosis not present

## 2015-04-17 DIAGNOSIS — E784 Other hyperlipidemia: Secondary | ICD-10-CM | POA: Diagnosis not present

## 2015-04-17 DIAGNOSIS — I25119 Atherosclerotic heart disease of native coronary artery with unspecified angina pectoris: Secondary | ICD-10-CM | POA: Diagnosis not present

## 2015-04-17 DIAGNOSIS — R64 Cachexia: Secondary | ICD-10-CM | POA: Diagnosis not present

## 2015-04-17 DIAGNOSIS — I495 Sick sinus syndrome: Secondary | ICD-10-CM | POA: Diagnosis not present

## 2015-04-26 DIAGNOSIS — R64 Cachexia: Secondary | ICD-10-CM | POA: Diagnosis not present

## 2015-04-26 DIAGNOSIS — I25119 Atherosclerotic heart disease of native coronary artery with unspecified angina pectoris: Secondary | ICD-10-CM | POA: Diagnosis not present

## 2015-04-26 DIAGNOSIS — M1993 Secondary osteoarthritis, unspecified site: Secondary | ICD-10-CM | POA: Diagnosis not present

## 2015-04-26 DIAGNOSIS — I495 Sick sinus syndrome: Secondary | ICD-10-CM | POA: Diagnosis not present

## 2015-04-26 DIAGNOSIS — I10 Essential (primary) hypertension: Secondary | ICD-10-CM | POA: Diagnosis not present

## 2015-04-26 DIAGNOSIS — E784 Other hyperlipidemia: Secondary | ICD-10-CM | POA: Diagnosis not present

## 2015-05-03 DIAGNOSIS — E784 Other hyperlipidemia: Secondary | ICD-10-CM | POA: Diagnosis not present

## 2015-05-03 DIAGNOSIS — M1993 Secondary osteoarthritis, unspecified site: Secondary | ICD-10-CM | POA: Diagnosis not present

## 2015-05-03 DIAGNOSIS — R64 Cachexia: Secondary | ICD-10-CM | POA: Diagnosis not present

## 2015-05-03 DIAGNOSIS — I25119 Atherosclerotic heart disease of native coronary artery with unspecified angina pectoris: Secondary | ICD-10-CM | POA: Diagnosis not present

## 2015-05-03 DIAGNOSIS — I495 Sick sinus syndrome: Secondary | ICD-10-CM | POA: Diagnosis not present

## 2015-05-03 DIAGNOSIS — I10 Essential (primary) hypertension: Secondary | ICD-10-CM | POA: Diagnosis not present

## 2015-05-08 DIAGNOSIS — R64 Cachexia: Secondary | ICD-10-CM | POA: Diagnosis not present

## 2015-05-08 DIAGNOSIS — I495 Sick sinus syndrome: Secondary | ICD-10-CM | POA: Diagnosis not present

## 2015-05-08 DIAGNOSIS — H409 Unspecified glaucoma: Secondary | ICD-10-CM | POA: Diagnosis not present

## 2015-05-08 DIAGNOSIS — I25119 Atherosclerotic heart disease of native coronary artery with unspecified angina pectoris: Secondary | ICD-10-CM | POA: Diagnosis not present

## 2015-05-08 DIAGNOSIS — E784 Other hyperlipidemia: Secondary | ICD-10-CM | POA: Diagnosis not present

## 2015-05-08 DIAGNOSIS — M1993 Secondary osteoarthritis, unspecified site: Secondary | ICD-10-CM | POA: Diagnosis not present

## 2015-05-08 DIAGNOSIS — I10 Essential (primary) hypertension: Secondary | ICD-10-CM | POA: Diagnosis not present

## 2015-05-14 DIAGNOSIS — M1993 Secondary osteoarthritis, unspecified site: Secondary | ICD-10-CM | POA: Diagnosis not present

## 2015-05-14 DIAGNOSIS — R64 Cachexia: Secondary | ICD-10-CM | POA: Diagnosis not present

## 2015-05-14 DIAGNOSIS — I495 Sick sinus syndrome: Secondary | ICD-10-CM | POA: Diagnosis not present

## 2015-05-14 DIAGNOSIS — E784 Other hyperlipidemia: Secondary | ICD-10-CM | POA: Diagnosis not present

## 2015-05-14 DIAGNOSIS — I10 Essential (primary) hypertension: Secondary | ICD-10-CM | POA: Diagnosis not present

## 2015-05-14 DIAGNOSIS — I25119 Atherosclerotic heart disease of native coronary artery with unspecified angina pectoris: Secondary | ICD-10-CM | POA: Diagnosis not present

## 2015-05-15 DIAGNOSIS — I10 Essential (primary) hypertension: Secondary | ICD-10-CM | POA: Diagnosis not present

## 2015-05-15 DIAGNOSIS — M1993 Secondary osteoarthritis, unspecified site: Secondary | ICD-10-CM | POA: Diagnosis not present

## 2015-05-15 DIAGNOSIS — E784 Other hyperlipidemia: Secondary | ICD-10-CM | POA: Diagnosis not present

## 2015-05-15 DIAGNOSIS — I495 Sick sinus syndrome: Secondary | ICD-10-CM | POA: Diagnosis not present

## 2015-05-15 DIAGNOSIS — I25119 Atherosclerotic heart disease of native coronary artery with unspecified angina pectoris: Secondary | ICD-10-CM | POA: Diagnosis not present

## 2015-05-15 DIAGNOSIS — R64 Cachexia: Secondary | ICD-10-CM | POA: Diagnosis not present

## 2015-05-22 DIAGNOSIS — I25119 Atherosclerotic heart disease of native coronary artery with unspecified angina pectoris: Secondary | ICD-10-CM | POA: Diagnosis not present

## 2015-05-22 DIAGNOSIS — R64 Cachexia: Secondary | ICD-10-CM | POA: Diagnosis not present

## 2015-05-22 DIAGNOSIS — M1993 Secondary osteoarthritis, unspecified site: Secondary | ICD-10-CM | POA: Diagnosis not present

## 2015-05-22 DIAGNOSIS — E784 Other hyperlipidemia: Secondary | ICD-10-CM | POA: Diagnosis not present

## 2015-05-22 DIAGNOSIS — I10 Essential (primary) hypertension: Secondary | ICD-10-CM | POA: Diagnosis not present

## 2015-05-22 DIAGNOSIS — I495 Sick sinus syndrome: Secondary | ICD-10-CM | POA: Diagnosis not present

## 2015-05-28 DIAGNOSIS — M1993 Secondary osteoarthritis, unspecified site: Secondary | ICD-10-CM | POA: Diagnosis not present

## 2015-05-28 DIAGNOSIS — I10 Essential (primary) hypertension: Secondary | ICD-10-CM | POA: Diagnosis not present

## 2015-05-28 DIAGNOSIS — I495 Sick sinus syndrome: Secondary | ICD-10-CM | POA: Diagnosis not present

## 2015-05-28 DIAGNOSIS — I25119 Atherosclerotic heart disease of native coronary artery with unspecified angina pectoris: Secondary | ICD-10-CM | POA: Diagnosis not present

## 2015-05-28 DIAGNOSIS — R64 Cachexia: Secondary | ICD-10-CM | POA: Diagnosis not present

## 2015-05-28 DIAGNOSIS — E784 Other hyperlipidemia: Secondary | ICD-10-CM | POA: Diagnosis not present

## 2015-06-04 DIAGNOSIS — I495 Sick sinus syndrome: Secondary | ICD-10-CM | POA: Diagnosis not present

## 2015-06-04 DIAGNOSIS — I10 Essential (primary) hypertension: Secondary | ICD-10-CM | POA: Diagnosis not present

## 2015-06-04 DIAGNOSIS — M1993 Secondary osteoarthritis, unspecified site: Secondary | ICD-10-CM | POA: Diagnosis not present

## 2015-06-04 DIAGNOSIS — R64 Cachexia: Secondary | ICD-10-CM | POA: Diagnosis not present

## 2015-06-04 DIAGNOSIS — E784 Other hyperlipidemia: Secondary | ICD-10-CM | POA: Diagnosis not present

## 2015-06-04 DIAGNOSIS — I25119 Atherosclerotic heart disease of native coronary artery with unspecified angina pectoris: Secondary | ICD-10-CM | POA: Diagnosis not present

## 2015-06-07 DIAGNOSIS — M1993 Secondary osteoarthritis, unspecified site: Secondary | ICD-10-CM | POA: Diagnosis not present

## 2015-06-07 DIAGNOSIS — I10 Essential (primary) hypertension: Secondary | ICD-10-CM | POA: Diagnosis not present

## 2015-06-07 DIAGNOSIS — E784 Other hyperlipidemia: Secondary | ICD-10-CM | POA: Diagnosis not present

## 2015-06-07 DIAGNOSIS — I495 Sick sinus syndrome: Secondary | ICD-10-CM | POA: Diagnosis not present

## 2015-06-07 DIAGNOSIS — I25119 Atherosclerotic heart disease of native coronary artery with unspecified angina pectoris: Secondary | ICD-10-CM | POA: Diagnosis not present

## 2015-06-07 DIAGNOSIS — R64 Cachexia: Secondary | ICD-10-CM | POA: Diagnosis not present

## 2015-06-07 DIAGNOSIS — H409 Unspecified glaucoma: Secondary | ICD-10-CM | POA: Diagnosis not present

## 2015-06-15 DIAGNOSIS — M1993 Secondary osteoarthritis, unspecified site: Secondary | ICD-10-CM | POA: Diagnosis not present

## 2015-06-15 DIAGNOSIS — R64 Cachexia: Secondary | ICD-10-CM | POA: Diagnosis not present

## 2015-06-15 DIAGNOSIS — E784 Other hyperlipidemia: Secondary | ICD-10-CM | POA: Diagnosis not present

## 2015-06-15 DIAGNOSIS — I495 Sick sinus syndrome: Secondary | ICD-10-CM | POA: Diagnosis not present

## 2015-06-15 DIAGNOSIS — I25119 Atherosclerotic heart disease of native coronary artery with unspecified angina pectoris: Secondary | ICD-10-CM | POA: Diagnosis not present

## 2015-06-15 DIAGNOSIS — I10 Essential (primary) hypertension: Secondary | ICD-10-CM | POA: Diagnosis not present

## 2015-06-20 DIAGNOSIS — E784 Other hyperlipidemia: Secondary | ICD-10-CM | POA: Diagnosis not present

## 2015-06-20 DIAGNOSIS — I25119 Atherosclerotic heart disease of native coronary artery with unspecified angina pectoris: Secondary | ICD-10-CM | POA: Diagnosis not present

## 2015-06-20 DIAGNOSIS — I10 Essential (primary) hypertension: Secondary | ICD-10-CM | POA: Diagnosis not present

## 2015-06-20 DIAGNOSIS — R64 Cachexia: Secondary | ICD-10-CM | POA: Diagnosis not present

## 2015-06-20 DIAGNOSIS — M1993 Secondary osteoarthritis, unspecified site: Secondary | ICD-10-CM | POA: Diagnosis not present

## 2015-06-20 DIAGNOSIS — I495 Sick sinus syndrome: Secondary | ICD-10-CM | POA: Diagnosis not present

## 2015-06-25 DIAGNOSIS — I25119 Atherosclerotic heart disease of native coronary artery with unspecified angina pectoris: Secondary | ICD-10-CM | POA: Diagnosis not present

## 2015-06-25 DIAGNOSIS — E784 Other hyperlipidemia: Secondary | ICD-10-CM | POA: Diagnosis not present

## 2015-06-25 DIAGNOSIS — I495 Sick sinus syndrome: Secondary | ICD-10-CM | POA: Diagnosis not present

## 2015-06-25 DIAGNOSIS — I10 Essential (primary) hypertension: Secondary | ICD-10-CM | POA: Diagnosis not present

## 2015-06-25 DIAGNOSIS — R64 Cachexia: Secondary | ICD-10-CM | POA: Diagnosis not present

## 2015-06-25 DIAGNOSIS — M1993 Secondary osteoarthritis, unspecified site: Secondary | ICD-10-CM | POA: Diagnosis not present

## 2015-07-03 DIAGNOSIS — I495 Sick sinus syndrome: Secondary | ICD-10-CM | POA: Diagnosis not present

## 2015-07-03 DIAGNOSIS — I25119 Atherosclerotic heart disease of native coronary artery with unspecified angina pectoris: Secondary | ICD-10-CM | POA: Diagnosis not present

## 2015-07-03 DIAGNOSIS — M1993 Secondary osteoarthritis, unspecified site: Secondary | ICD-10-CM | POA: Diagnosis not present

## 2015-07-03 DIAGNOSIS — E784 Other hyperlipidemia: Secondary | ICD-10-CM | POA: Diagnosis not present

## 2015-07-03 DIAGNOSIS — I10 Essential (primary) hypertension: Secondary | ICD-10-CM | POA: Diagnosis not present

## 2015-07-03 DIAGNOSIS — R64 Cachexia: Secondary | ICD-10-CM | POA: Diagnosis not present

## 2015-07-05 DIAGNOSIS — E784 Other hyperlipidemia: Secondary | ICD-10-CM | POA: Diagnosis not present

## 2015-07-05 DIAGNOSIS — I25119 Atherosclerotic heart disease of native coronary artery with unspecified angina pectoris: Secondary | ICD-10-CM | POA: Diagnosis not present

## 2015-07-05 DIAGNOSIS — I10 Essential (primary) hypertension: Secondary | ICD-10-CM | POA: Diagnosis not present

## 2015-07-05 DIAGNOSIS — I495 Sick sinus syndrome: Secondary | ICD-10-CM | POA: Diagnosis not present

## 2015-07-05 DIAGNOSIS — M1993 Secondary osteoarthritis, unspecified site: Secondary | ICD-10-CM | POA: Diagnosis not present

## 2015-07-05 DIAGNOSIS — R64 Cachexia: Secondary | ICD-10-CM | POA: Diagnosis not present

## 2015-07-08 DIAGNOSIS — I495 Sick sinus syndrome: Secondary | ICD-10-CM | POA: Diagnosis not present

## 2015-07-08 DIAGNOSIS — I10 Essential (primary) hypertension: Secondary | ICD-10-CM | POA: Diagnosis not present

## 2015-07-08 DIAGNOSIS — I25119 Atherosclerotic heart disease of native coronary artery with unspecified angina pectoris: Secondary | ICD-10-CM | POA: Diagnosis not present

## 2015-07-08 DIAGNOSIS — M1993 Secondary osteoarthritis, unspecified site: Secondary | ICD-10-CM | POA: Diagnosis not present

## 2015-07-08 DIAGNOSIS — R64 Cachexia: Secondary | ICD-10-CM | POA: Diagnosis not present

## 2015-07-08 DIAGNOSIS — H409 Unspecified glaucoma: Secondary | ICD-10-CM | POA: Diagnosis not present

## 2015-07-08 DIAGNOSIS — E784 Other hyperlipidemia: Secondary | ICD-10-CM | POA: Diagnosis not present

## 2015-07-10 DIAGNOSIS — E784 Other hyperlipidemia: Secondary | ICD-10-CM | POA: Diagnosis not present

## 2015-07-10 DIAGNOSIS — I25119 Atherosclerotic heart disease of native coronary artery with unspecified angina pectoris: Secondary | ICD-10-CM | POA: Diagnosis not present

## 2015-07-10 DIAGNOSIS — I495 Sick sinus syndrome: Secondary | ICD-10-CM | POA: Diagnosis not present

## 2015-07-10 DIAGNOSIS — R64 Cachexia: Secondary | ICD-10-CM | POA: Diagnosis not present

## 2015-07-10 DIAGNOSIS — M1993 Secondary osteoarthritis, unspecified site: Secondary | ICD-10-CM | POA: Diagnosis not present

## 2015-07-10 DIAGNOSIS — I10 Essential (primary) hypertension: Secondary | ICD-10-CM | POA: Diagnosis not present

## 2015-07-11 DIAGNOSIS — I872 Venous insufficiency (chronic) (peripheral): Secondary | ICD-10-CM | POA: Diagnosis not present

## 2015-07-11 DIAGNOSIS — Z9861 Coronary angioplasty status: Secondary | ICD-10-CM | POA: Diagnosis not present

## 2015-07-11 DIAGNOSIS — I25119 Atherosclerotic heart disease of native coronary artery with unspecified angina pectoris: Secondary | ICD-10-CM | POA: Diagnosis not present

## 2015-07-11 DIAGNOSIS — I119 Hypertensive heart disease without heart failure: Secondary | ICD-10-CM | POA: Diagnosis not present

## 2015-07-11 DIAGNOSIS — I208 Other forms of angina pectoris: Secondary | ICD-10-CM | POA: Diagnosis not present

## 2015-07-11 DIAGNOSIS — E785 Hyperlipidemia, unspecified: Secondary | ICD-10-CM | POA: Diagnosis not present

## 2015-07-18 DIAGNOSIS — I495 Sick sinus syndrome: Secondary | ICD-10-CM | POA: Diagnosis not present

## 2015-07-18 DIAGNOSIS — M1993 Secondary osteoarthritis, unspecified site: Secondary | ICD-10-CM | POA: Diagnosis not present

## 2015-07-18 DIAGNOSIS — I25119 Atherosclerotic heart disease of native coronary artery with unspecified angina pectoris: Secondary | ICD-10-CM | POA: Diagnosis not present

## 2015-07-18 DIAGNOSIS — E784 Other hyperlipidemia: Secondary | ICD-10-CM | POA: Diagnosis not present

## 2015-07-18 DIAGNOSIS — I10 Essential (primary) hypertension: Secondary | ICD-10-CM | POA: Diagnosis not present

## 2015-07-18 DIAGNOSIS — R64 Cachexia: Secondary | ICD-10-CM | POA: Diagnosis not present

## 2015-07-24 DIAGNOSIS — I10 Essential (primary) hypertension: Secondary | ICD-10-CM | POA: Diagnosis not present

## 2015-07-24 DIAGNOSIS — M1993 Secondary osteoarthritis, unspecified site: Secondary | ICD-10-CM | POA: Diagnosis not present

## 2015-07-24 DIAGNOSIS — I25119 Atherosclerotic heart disease of native coronary artery with unspecified angina pectoris: Secondary | ICD-10-CM | POA: Diagnosis not present

## 2015-07-24 DIAGNOSIS — R64 Cachexia: Secondary | ICD-10-CM | POA: Diagnosis not present

## 2015-07-24 DIAGNOSIS — E784 Other hyperlipidemia: Secondary | ICD-10-CM | POA: Diagnosis not present

## 2015-07-24 DIAGNOSIS — I495 Sick sinus syndrome: Secondary | ICD-10-CM | POA: Diagnosis not present

## 2015-07-25 DIAGNOSIS — E784 Other hyperlipidemia: Secondary | ICD-10-CM | POA: Diagnosis not present

## 2015-07-25 DIAGNOSIS — M1993 Secondary osteoarthritis, unspecified site: Secondary | ICD-10-CM | POA: Diagnosis not present

## 2015-07-25 DIAGNOSIS — I10 Essential (primary) hypertension: Secondary | ICD-10-CM | POA: Diagnosis not present

## 2015-07-25 DIAGNOSIS — I25119 Atherosclerotic heart disease of native coronary artery with unspecified angina pectoris: Secondary | ICD-10-CM | POA: Diagnosis not present

## 2015-07-25 DIAGNOSIS — I495 Sick sinus syndrome: Secondary | ICD-10-CM | POA: Diagnosis not present

## 2015-07-25 DIAGNOSIS — R64 Cachexia: Secondary | ICD-10-CM | POA: Diagnosis not present

## 2015-08-02 DIAGNOSIS — R64 Cachexia: Secondary | ICD-10-CM | POA: Diagnosis not present

## 2015-08-02 DIAGNOSIS — E784 Other hyperlipidemia: Secondary | ICD-10-CM | POA: Diagnosis not present

## 2015-08-02 DIAGNOSIS — M1993 Secondary osteoarthritis, unspecified site: Secondary | ICD-10-CM | POA: Diagnosis not present

## 2015-08-02 DIAGNOSIS — I10 Essential (primary) hypertension: Secondary | ICD-10-CM | POA: Diagnosis not present

## 2015-08-02 DIAGNOSIS — I495 Sick sinus syndrome: Secondary | ICD-10-CM | POA: Diagnosis not present

## 2015-08-02 DIAGNOSIS — I25119 Atherosclerotic heart disease of native coronary artery with unspecified angina pectoris: Secondary | ICD-10-CM | POA: Diagnosis not present

## 2015-08-08 DIAGNOSIS — R64 Cachexia: Secondary | ICD-10-CM | POA: Diagnosis not present

## 2015-08-08 DIAGNOSIS — I25119 Atherosclerotic heart disease of native coronary artery with unspecified angina pectoris: Secondary | ICD-10-CM | POA: Diagnosis not present

## 2015-08-08 DIAGNOSIS — H409 Unspecified glaucoma: Secondary | ICD-10-CM | POA: Diagnosis not present

## 2015-08-08 DIAGNOSIS — I495 Sick sinus syndrome: Secondary | ICD-10-CM | POA: Diagnosis not present

## 2015-08-08 DIAGNOSIS — M1993 Secondary osteoarthritis, unspecified site: Secondary | ICD-10-CM | POA: Diagnosis not present

## 2015-08-08 DIAGNOSIS — E784 Other hyperlipidemia: Secondary | ICD-10-CM | POA: Diagnosis not present

## 2015-08-08 DIAGNOSIS — I10 Essential (primary) hypertension: Secondary | ICD-10-CM | POA: Diagnosis not present

## 2015-08-13 DIAGNOSIS — E784 Other hyperlipidemia: Secondary | ICD-10-CM | POA: Diagnosis not present

## 2015-08-13 DIAGNOSIS — M1993 Secondary osteoarthritis, unspecified site: Secondary | ICD-10-CM | POA: Diagnosis not present

## 2015-08-13 DIAGNOSIS — I495 Sick sinus syndrome: Secondary | ICD-10-CM | POA: Diagnosis not present

## 2015-08-13 DIAGNOSIS — R64 Cachexia: Secondary | ICD-10-CM | POA: Diagnosis not present

## 2015-08-13 DIAGNOSIS — I10 Essential (primary) hypertension: Secondary | ICD-10-CM | POA: Diagnosis not present

## 2015-08-13 DIAGNOSIS — I25119 Atherosclerotic heart disease of native coronary artery with unspecified angina pectoris: Secondary | ICD-10-CM | POA: Diagnosis not present

## 2015-08-20 DIAGNOSIS — E784 Other hyperlipidemia: Secondary | ICD-10-CM | POA: Diagnosis not present

## 2015-08-20 DIAGNOSIS — M1993 Secondary osteoarthritis, unspecified site: Secondary | ICD-10-CM | POA: Diagnosis not present

## 2015-08-20 DIAGNOSIS — R64 Cachexia: Secondary | ICD-10-CM | POA: Diagnosis not present

## 2015-08-20 DIAGNOSIS — I10 Essential (primary) hypertension: Secondary | ICD-10-CM | POA: Diagnosis not present

## 2015-08-20 DIAGNOSIS — I25119 Atherosclerotic heart disease of native coronary artery with unspecified angina pectoris: Secondary | ICD-10-CM | POA: Diagnosis not present

## 2015-08-20 DIAGNOSIS — I495 Sick sinus syndrome: Secondary | ICD-10-CM | POA: Diagnosis not present

## 2015-08-28 DIAGNOSIS — E784 Other hyperlipidemia: Secondary | ICD-10-CM | POA: Diagnosis not present

## 2015-08-28 DIAGNOSIS — R64 Cachexia: Secondary | ICD-10-CM | POA: Diagnosis not present

## 2015-08-28 DIAGNOSIS — M1993 Secondary osteoarthritis, unspecified site: Secondary | ICD-10-CM | POA: Diagnosis not present

## 2015-08-28 DIAGNOSIS — I10 Essential (primary) hypertension: Secondary | ICD-10-CM | POA: Diagnosis not present

## 2015-08-28 DIAGNOSIS — I495 Sick sinus syndrome: Secondary | ICD-10-CM | POA: Diagnosis not present

## 2015-08-28 DIAGNOSIS — I25119 Atherosclerotic heart disease of native coronary artery with unspecified angina pectoris: Secondary | ICD-10-CM | POA: Diagnosis not present

## 2015-08-31 DIAGNOSIS — R64 Cachexia: Secondary | ICD-10-CM | POA: Diagnosis not present

## 2015-08-31 DIAGNOSIS — I10 Essential (primary) hypertension: Secondary | ICD-10-CM | POA: Diagnosis not present

## 2015-08-31 DIAGNOSIS — I495 Sick sinus syndrome: Secondary | ICD-10-CM | POA: Diagnosis not present

## 2015-08-31 DIAGNOSIS — I25119 Atherosclerotic heart disease of native coronary artery with unspecified angina pectoris: Secondary | ICD-10-CM | POA: Diagnosis not present

## 2015-08-31 DIAGNOSIS — M1993 Secondary osteoarthritis, unspecified site: Secondary | ICD-10-CM | POA: Diagnosis not present

## 2015-08-31 DIAGNOSIS — E784 Other hyperlipidemia: Secondary | ICD-10-CM | POA: Diagnosis not present

## 2015-09-04 DIAGNOSIS — I10 Essential (primary) hypertension: Secondary | ICD-10-CM | POA: Diagnosis not present

## 2015-09-04 DIAGNOSIS — I25119 Atherosclerotic heart disease of native coronary artery with unspecified angina pectoris: Secondary | ICD-10-CM | POA: Diagnosis not present

## 2015-09-04 DIAGNOSIS — E784 Other hyperlipidemia: Secondary | ICD-10-CM | POA: Diagnosis not present

## 2015-09-04 DIAGNOSIS — I495 Sick sinus syndrome: Secondary | ICD-10-CM | POA: Diagnosis not present

## 2015-09-04 DIAGNOSIS — R64 Cachexia: Secondary | ICD-10-CM | POA: Diagnosis not present

## 2015-09-04 DIAGNOSIS — M1993 Secondary osteoarthritis, unspecified site: Secondary | ICD-10-CM | POA: Diagnosis not present

## 2015-09-05 DIAGNOSIS — I10 Essential (primary) hypertension: Secondary | ICD-10-CM | POA: Diagnosis not present

## 2015-09-05 DIAGNOSIS — E784 Other hyperlipidemia: Secondary | ICD-10-CM | POA: Diagnosis not present

## 2015-09-05 DIAGNOSIS — H409 Unspecified glaucoma: Secondary | ICD-10-CM | POA: Diagnosis not present

## 2015-09-05 DIAGNOSIS — I25119 Atherosclerotic heart disease of native coronary artery with unspecified angina pectoris: Secondary | ICD-10-CM | POA: Diagnosis not present

## 2015-09-05 DIAGNOSIS — M1993 Secondary osteoarthritis, unspecified site: Secondary | ICD-10-CM | POA: Diagnosis not present

## 2015-09-05 DIAGNOSIS — I495 Sick sinus syndrome: Secondary | ICD-10-CM | POA: Diagnosis not present

## 2015-09-05 DIAGNOSIS — R64 Cachexia: Secondary | ICD-10-CM | POA: Diagnosis not present

## 2015-09-11 DIAGNOSIS — M1993 Secondary osteoarthritis, unspecified site: Secondary | ICD-10-CM | POA: Diagnosis not present

## 2015-09-11 DIAGNOSIS — E784 Other hyperlipidemia: Secondary | ICD-10-CM | POA: Diagnosis not present

## 2015-09-11 DIAGNOSIS — R64 Cachexia: Secondary | ICD-10-CM | POA: Diagnosis not present

## 2015-09-11 DIAGNOSIS — I25119 Atherosclerotic heart disease of native coronary artery with unspecified angina pectoris: Secondary | ICD-10-CM | POA: Diagnosis not present

## 2015-09-11 DIAGNOSIS — I10 Essential (primary) hypertension: Secondary | ICD-10-CM | POA: Diagnosis not present

## 2015-09-11 DIAGNOSIS — I495 Sick sinus syndrome: Secondary | ICD-10-CM | POA: Diagnosis not present

## 2015-09-18 DIAGNOSIS — I10 Essential (primary) hypertension: Secondary | ICD-10-CM | POA: Diagnosis not present

## 2015-09-18 DIAGNOSIS — I495 Sick sinus syndrome: Secondary | ICD-10-CM | POA: Diagnosis not present

## 2015-09-18 DIAGNOSIS — M1993 Secondary osteoarthritis, unspecified site: Secondary | ICD-10-CM | POA: Diagnosis not present

## 2015-09-18 DIAGNOSIS — I25119 Atherosclerotic heart disease of native coronary artery with unspecified angina pectoris: Secondary | ICD-10-CM | POA: Diagnosis not present

## 2015-09-18 DIAGNOSIS — R64 Cachexia: Secondary | ICD-10-CM | POA: Diagnosis not present

## 2015-09-18 DIAGNOSIS — E784 Other hyperlipidemia: Secondary | ICD-10-CM | POA: Diagnosis not present

## 2015-09-25 DIAGNOSIS — E784 Other hyperlipidemia: Secondary | ICD-10-CM | POA: Diagnosis not present

## 2015-09-25 DIAGNOSIS — I25119 Atherosclerotic heart disease of native coronary artery with unspecified angina pectoris: Secondary | ICD-10-CM | POA: Diagnosis not present

## 2015-09-25 DIAGNOSIS — I495 Sick sinus syndrome: Secondary | ICD-10-CM | POA: Diagnosis not present

## 2015-09-25 DIAGNOSIS — R64 Cachexia: Secondary | ICD-10-CM | POA: Diagnosis not present

## 2015-09-25 DIAGNOSIS — I10 Essential (primary) hypertension: Secondary | ICD-10-CM | POA: Diagnosis not present

## 2015-09-25 DIAGNOSIS — M1993 Secondary osteoarthritis, unspecified site: Secondary | ICD-10-CM | POA: Diagnosis not present

## 2015-09-26 DIAGNOSIS — M1993 Secondary osteoarthritis, unspecified site: Secondary | ICD-10-CM | POA: Diagnosis not present

## 2015-09-26 DIAGNOSIS — R64 Cachexia: Secondary | ICD-10-CM | POA: Diagnosis not present

## 2015-09-26 DIAGNOSIS — I25119 Atherosclerotic heart disease of native coronary artery with unspecified angina pectoris: Secondary | ICD-10-CM | POA: Diagnosis not present

## 2015-09-26 DIAGNOSIS — I10 Essential (primary) hypertension: Secondary | ICD-10-CM | POA: Diagnosis not present

## 2015-09-26 DIAGNOSIS — I495 Sick sinus syndrome: Secondary | ICD-10-CM | POA: Diagnosis not present

## 2015-09-26 DIAGNOSIS — E784 Other hyperlipidemia: Secondary | ICD-10-CM | POA: Diagnosis not present

## 2015-10-01 DIAGNOSIS — R001 Bradycardia, unspecified: Secondary | ICD-10-CM | POA: Diagnosis not present

## 2015-10-01 DIAGNOSIS — I872 Venous insufficiency (chronic) (peripheral): Secondary | ICD-10-CM | POA: Diagnosis not present

## 2015-10-01 DIAGNOSIS — D61818 Other pancytopenia: Secondary | ICD-10-CM | POA: Diagnosis not present

## 2015-10-01 DIAGNOSIS — I208 Other forms of angina pectoris: Secondary | ICD-10-CM | POA: Diagnosis not present

## 2015-10-01 DIAGNOSIS — Z9861 Coronary angioplasty status: Secondary | ICD-10-CM | POA: Diagnosis not present

## 2015-10-01 DIAGNOSIS — I25119 Atherosclerotic heart disease of native coronary artery with unspecified angina pectoris: Secondary | ICD-10-CM | POA: Diagnosis not present

## 2015-10-01 DIAGNOSIS — E785 Hyperlipidemia, unspecified: Secondary | ICD-10-CM | POA: Diagnosis not present

## 2015-10-01 DIAGNOSIS — I119 Hypertensive heart disease without heart failure: Secondary | ICD-10-CM | POA: Diagnosis not present

## 2015-10-04 DIAGNOSIS — E784 Other hyperlipidemia: Secondary | ICD-10-CM | POA: Diagnosis not present

## 2015-10-04 DIAGNOSIS — I495 Sick sinus syndrome: Secondary | ICD-10-CM | POA: Diagnosis not present

## 2015-10-04 DIAGNOSIS — R64 Cachexia: Secondary | ICD-10-CM | POA: Diagnosis not present

## 2015-10-04 DIAGNOSIS — M1993 Secondary osteoarthritis, unspecified site: Secondary | ICD-10-CM | POA: Diagnosis not present

## 2015-10-04 DIAGNOSIS — I25119 Atherosclerotic heart disease of native coronary artery with unspecified angina pectoris: Secondary | ICD-10-CM | POA: Diagnosis not present

## 2015-10-04 DIAGNOSIS — I10 Essential (primary) hypertension: Secondary | ICD-10-CM | POA: Diagnosis not present

## 2015-10-06 DIAGNOSIS — E784 Other hyperlipidemia: Secondary | ICD-10-CM | POA: Diagnosis not present

## 2015-10-06 DIAGNOSIS — M1993 Secondary osteoarthritis, unspecified site: Secondary | ICD-10-CM | POA: Diagnosis not present

## 2015-10-06 DIAGNOSIS — H409 Unspecified glaucoma: Secondary | ICD-10-CM | POA: Diagnosis not present

## 2015-10-06 DIAGNOSIS — R64 Cachexia: Secondary | ICD-10-CM | POA: Diagnosis not present

## 2015-10-06 DIAGNOSIS — I25119 Atherosclerotic heart disease of native coronary artery with unspecified angina pectoris: Secondary | ICD-10-CM | POA: Diagnosis not present

## 2015-10-06 DIAGNOSIS — I495 Sick sinus syndrome: Secondary | ICD-10-CM | POA: Diagnosis not present

## 2015-10-06 DIAGNOSIS — I10 Essential (primary) hypertension: Secondary | ICD-10-CM | POA: Diagnosis not present

## 2015-10-08 DIAGNOSIS — M1993 Secondary osteoarthritis, unspecified site: Secondary | ICD-10-CM | POA: Diagnosis not present

## 2015-10-08 DIAGNOSIS — R64 Cachexia: Secondary | ICD-10-CM | POA: Diagnosis not present

## 2015-10-08 DIAGNOSIS — I10 Essential (primary) hypertension: Secondary | ICD-10-CM | POA: Diagnosis not present

## 2015-10-08 DIAGNOSIS — I25119 Atherosclerotic heart disease of native coronary artery with unspecified angina pectoris: Secondary | ICD-10-CM | POA: Diagnosis not present

## 2015-10-08 DIAGNOSIS — I495 Sick sinus syndrome: Secondary | ICD-10-CM | POA: Diagnosis not present

## 2015-10-08 DIAGNOSIS — E784 Other hyperlipidemia: Secondary | ICD-10-CM | POA: Diagnosis not present

## 2015-10-12 DIAGNOSIS — I25119 Atherosclerotic heart disease of native coronary artery with unspecified angina pectoris: Secondary | ICD-10-CM | POA: Diagnosis not present

## 2015-10-12 DIAGNOSIS — I10 Essential (primary) hypertension: Secondary | ICD-10-CM | POA: Diagnosis not present

## 2015-10-12 DIAGNOSIS — I495 Sick sinus syndrome: Secondary | ICD-10-CM | POA: Diagnosis not present

## 2015-10-12 DIAGNOSIS — E784 Other hyperlipidemia: Secondary | ICD-10-CM | POA: Diagnosis not present

## 2015-10-12 DIAGNOSIS — R64 Cachexia: Secondary | ICD-10-CM | POA: Diagnosis not present

## 2015-10-12 DIAGNOSIS — M1993 Secondary osteoarthritis, unspecified site: Secondary | ICD-10-CM | POA: Diagnosis not present

## 2015-10-26 DIAGNOSIS — E784 Other hyperlipidemia: Secondary | ICD-10-CM | POA: Diagnosis not present

## 2015-10-26 DIAGNOSIS — I25119 Atherosclerotic heart disease of native coronary artery with unspecified angina pectoris: Secondary | ICD-10-CM | POA: Diagnosis not present

## 2015-10-26 DIAGNOSIS — I10 Essential (primary) hypertension: Secondary | ICD-10-CM | POA: Diagnosis not present

## 2015-10-26 DIAGNOSIS — I495 Sick sinus syndrome: Secondary | ICD-10-CM | POA: Diagnosis not present

## 2015-10-26 DIAGNOSIS — M1993 Secondary osteoarthritis, unspecified site: Secondary | ICD-10-CM | POA: Diagnosis not present

## 2015-10-26 DIAGNOSIS — R64 Cachexia: Secondary | ICD-10-CM | POA: Diagnosis not present

## 2015-10-29 DIAGNOSIS — E784 Other hyperlipidemia: Secondary | ICD-10-CM | POA: Diagnosis not present

## 2015-10-29 DIAGNOSIS — I25119 Atherosclerotic heart disease of native coronary artery with unspecified angina pectoris: Secondary | ICD-10-CM | POA: Diagnosis not present

## 2015-10-29 DIAGNOSIS — M1993 Secondary osteoarthritis, unspecified site: Secondary | ICD-10-CM | POA: Diagnosis not present

## 2015-10-29 DIAGNOSIS — R64 Cachexia: Secondary | ICD-10-CM | POA: Diagnosis not present

## 2015-10-29 DIAGNOSIS — I10 Essential (primary) hypertension: Secondary | ICD-10-CM | POA: Diagnosis not present

## 2015-10-29 DIAGNOSIS — I495 Sick sinus syndrome: Secondary | ICD-10-CM | POA: Diagnosis not present

## 2015-11-05 DIAGNOSIS — M1993 Secondary osteoarthritis, unspecified site: Secondary | ICD-10-CM | POA: Diagnosis not present

## 2015-11-05 DIAGNOSIS — I495 Sick sinus syndrome: Secondary | ICD-10-CM | POA: Diagnosis not present

## 2015-11-05 DIAGNOSIS — I10 Essential (primary) hypertension: Secondary | ICD-10-CM | POA: Diagnosis not present

## 2015-11-05 DIAGNOSIS — H409 Unspecified glaucoma: Secondary | ICD-10-CM | POA: Diagnosis not present

## 2015-11-05 DIAGNOSIS — E784 Other hyperlipidemia: Secondary | ICD-10-CM | POA: Diagnosis not present

## 2015-11-05 DIAGNOSIS — I25119 Atherosclerotic heart disease of native coronary artery with unspecified angina pectoris: Secondary | ICD-10-CM | POA: Diagnosis not present

## 2015-11-05 DIAGNOSIS — R64 Cachexia: Secondary | ICD-10-CM | POA: Diagnosis not present

## 2015-11-09 DIAGNOSIS — M1993 Secondary osteoarthritis, unspecified site: Secondary | ICD-10-CM | POA: Diagnosis not present

## 2015-11-09 DIAGNOSIS — E784 Other hyperlipidemia: Secondary | ICD-10-CM | POA: Diagnosis not present

## 2015-11-09 DIAGNOSIS — I10 Essential (primary) hypertension: Secondary | ICD-10-CM | POA: Diagnosis not present

## 2015-11-09 DIAGNOSIS — R64 Cachexia: Secondary | ICD-10-CM | POA: Diagnosis not present

## 2015-11-09 DIAGNOSIS — I495 Sick sinus syndrome: Secondary | ICD-10-CM | POA: Diagnosis not present

## 2015-11-09 DIAGNOSIS — I25119 Atherosclerotic heart disease of native coronary artery with unspecified angina pectoris: Secondary | ICD-10-CM | POA: Diagnosis not present

## 2015-11-13 DIAGNOSIS — D61818 Other pancytopenia: Secondary | ICD-10-CM | POA: Diagnosis not present

## 2015-11-13 DIAGNOSIS — I208 Other forms of angina pectoris: Secondary | ICD-10-CM | POA: Diagnosis not present

## 2015-11-13 DIAGNOSIS — I25119 Atherosclerotic heart disease of native coronary artery with unspecified angina pectoris: Secondary | ICD-10-CM | POA: Diagnosis not present

## 2015-11-13 DIAGNOSIS — Z9861 Coronary angioplasty status: Secondary | ICD-10-CM | POA: Diagnosis not present

## 2015-11-13 DIAGNOSIS — I872 Venous insufficiency (chronic) (peripheral): Secondary | ICD-10-CM | POA: Diagnosis not present

## 2015-11-13 DIAGNOSIS — R001 Bradycardia, unspecified: Secondary | ICD-10-CM | POA: Diagnosis not present

## 2015-11-13 DIAGNOSIS — I119 Hypertensive heart disease without heart failure: Secondary | ICD-10-CM | POA: Diagnosis not present

## 2015-11-13 DIAGNOSIS — E785 Hyperlipidemia, unspecified: Secondary | ICD-10-CM | POA: Diagnosis not present

## 2015-11-16 DIAGNOSIS — I495 Sick sinus syndrome: Secondary | ICD-10-CM | POA: Diagnosis not present

## 2015-11-16 DIAGNOSIS — E784 Other hyperlipidemia: Secondary | ICD-10-CM | POA: Diagnosis not present

## 2015-11-16 DIAGNOSIS — I10 Essential (primary) hypertension: Secondary | ICD-10-CM | POA: Diagnosis not present

## 2015-11-16 DIAGNOSIS — R64 Cachexia: Secondary | ICD-10-CM | POA: Diagnosis not present

## 2015-11-16 DIAGNOSIS — M1993 Secondary osteoarthritis, unspecified site: Secondary | ICD-10-CM | POA: Diagnosis not present

## 2015-11-16 DIAGNOSIS — I25119 Atherosclerotic heart disease of native coronary artery with unspecified angina pectoris: Secondary | ICD-10-CM | POA: Diagnosis not present

## 2015-11-23 DIAGNOSIS — I10 Essential (primary) hypertension: Secondary | ICD-10-CM | POA: Diagnosis not present

## 2015-11-23 DIAGNOSIS — I25119 Atherosclerotic heart disease of native coronary artery with unspecified angina pectoris: Secondary | ICD-10-CM | POA: Diagnosis not present

## 2015-11-23 DIAGNOSIS — R64 Cachexia: Secondary | ICD-10-CM | POA: Diagnosis not present

## 2015-11-23 DIAGNOSIS — E784 Other hyperlipidemia: Secondary | ICD-10-CM | POA: Diagnosis not present

## 2015-11-23 DIAGNOSIS — M1993 Secondary osteoarthritis, unspecified site: Secondary | ICD-10-CM | POA: Diagnosis not present

## 2015-11-23 DIAGNOSIS — I495 Sick sinus syndrome: Secondary | ICD-10-CM | POA: Diagnosis not present

## 2015-11-26 DIAGNOSIS — R64 Cachexia: Secondary | ICD-10-CM | POA: Diagnosis not present

## 2015-11-26 DIAGNOSIS — I25119 Atherosclerotic heart disease of native coronary artery with unspecified angina pectoris: Secondary | ICD-10-CM | POA: Diagnosis not present

## 2015-11-26 DIAGNOSIS — M1993 Secondary osteoarthritis, unspecified site: Secondary | ICD-10-CM | POA: Diagnosis not present

## 2015-11-26 DIAGNOSIS — E784 Other hyperlipidemia: Secondary | ICD-10-CM | POA: Diagnosis not present

## 2015-11-26 DIAGNOSIS — I10 Essential (primary) hypertension: Secondary | ICD-10-CM | POA: Diagnosis not present

## 2015-11-26 DIAGNOSIS — I495 Sick sinus syndrome: Secondary | ICD-10-CM | POA: Diagnosis not present

## 2015-12-04 DIAGNOSIS — E784 Other hyperlipidemia: Secondary | ICD-10-CM | POA: Diagnosis not present

## 2015-12-04 DIAGNOSIS — I10 Essential (primary) hypertension: Secondary | ICD-10-CM | POA: Diagnosis not present

## 2015-12-04 DIAGNOSIS — M1993 Secondary osteoarthritis, unspecified site: Secondary | ICD-10-CM | POA: Diagnosis not present

## 2015-12-04 DIAGNOSIS — R64 Cachexia: Secondary | ICD-10-CM | POA: Diagnosis not present

## 2015-12-04 DIAGNOSIS — I495 Sick sinus syndrome: Secondary | ICD-10-CM | POA: Diagnosis not present

## 2015-12-04 DIAGNOSIS — I25119 Atherosclerotic heart disease of native coronary artery with unspecified angina pectoris: Secondary | ICD-10-CM | POA: Diagnosis not present

## 2015-12-06 DIAGNOSIS — I10 Essential (primary) hypertension: Secondary | ICD-10-CM | POA: Diagnosis not present

## 2015-12-06 DIAGNOSIS — E784 Other hyperlipidemia: Secondary | ICD-10-CM | POA: Diagnosis not present

## 2015-12-06 DIAGNOSIS — I25119 Atherosclerotic heart disease of native coronary artery with unspecified angina pectoris: Secondary | ICD-10-CM | POA: Diagnosis not present

## 2015-12-06 DIAGNOSIS — M1993 Secondary osteoarthritis, unspecified site: Secondary | ICD-10-CM | POA: Diagnosis not present

## 2015-12-06 DIAGNOSIS — I495 Sick sinus syndrome: Secondary | ICD-10-CM | POA: Diagnosis not present

## 2015-12-06 DIAGNOSIS — R64 Cachexia: Secondary | ICD-10-CM | POA: Diagnosis not present

## 2015-12-06 DIAGNOSIS — H409 Unspecified glaucoma: Secondary | ICD-10-CM | POA: Diagnosis not present

## 2015-12-07 DIAGNOSIS — I25119 Atherosclerotic heart disease of native coronary artery with unspecified angina pectoris: Secondary | ICD-10-CM | POA: Diagnosis not present

## 2015-12-07 DIAGNOSIS — E784 Other hyperlipidemia: Secondary | ICD-10-CM | POA: Diagnosis not present

## 2015-12-07 DIAGNOSIS — M1993 Secondary osteoarthritis, unspecified site: Secondary | ICD-10-CM | POA: Diagnosis not present

## 2015-12-07 DIAGNOSIS — R64 Cachexia: Secondary | ICD-10-CM | POA: Diagnosis not present

## 2015-12-07 DIAGNOSIS — I10 Essential (primary) hypertension: Secondary | ICD-10-CM | POA: Diagnosis not present

## 2015-12-07 DIAGNOSIS — I495 Sick sinus syndrome: Secondary | ICD-10-CM | POA: Diagnosis not present

## 2015-12-11 DIAGNOSIS — E784 Other hyperlipidemia: Secondary | ICD-10-CM | POA: Diagnosis not present

## 2015-12-11 DIAGNOSIS — M1993 Secondary osteoarthritis, unspecified site: Secondary | ICD-10-CM | POA: Diagnosis not present

## 2015-12-11 DIAGNOSIS — I495 Sick sinus syndrome: Secondary | ICD-10-CM | POA: Diagnosis not present

## 2015-12-11 DIAGNOSIS — I25119 Atherosclerotic heart disease of native coronary artery with unspecified angina pectoris: Secondary | ICD-10-CM | POA: Diagnosis not present

## 2015-12-11 DIAGNOSIS — I10 Essential (primary) hypertension: Secondary | ICD-10-CM | POA: Diagnosis not present

## 2015-12-11 DIAGNOSIS — R64 Cachexia: Secondary | ICD-10-CM | POA: Diagnosis not present

## 2015-12-12 DIAGNOSIS — R64 Cachexia: Secondary | ICD-10-CM | POA: Diagnosis not present

## 2015-12-12 DIAGNOSIS — I25119 Atherosclerotic heart disease of native coronary artery with unspecified angina pectoris: Secondary | ICD-10-CM | POA: Diagnosis not present

## 2015-12-12 DIAGNOSIS — I495 Sick sinus syndrome: Secondary | ICD-10-CM | POA: Diagnosis not present

## 2015-12-12 DIAGNOSIS — I10 Essential (primary) hypertension: Secondary | ICD-10-CM | POA: Diagnosis not present

## 2015-12-12 DIAGNOSIS — E784 Other hyperlipidemia: Secondary | ICD-10-CM | POA: Diagnosis not present

## 2015-12-12 DIAGNOSIS — M1993 Secondary osteoarthritis, unspecified site: Secondary | ICD-10-CM | POA: Diagnosis not present

## 2015-12-14 DIAGNOSIS — I25119 Atherosclerotic heart disease of native coronary artery with unspecified angina pectoris: Secondary | ICD-10-CM | POA: Diagnosis not present

## 2015-12-14 DIAGNOSIS — E784 Other hyperlipidemia: Secondary | ICD-10-CM | POA: Diagnosis not present

## 2015-12-14 DIAGNOSIS — M1993 Secondary osteoarthritis, unspecified site: Secondary | ICD-10-CM | POA: Diagnosis not present

## 2015-12-14 DIAGNOSIS — R64 Cachexia: Secondary | ICD-10-CM | POA: Diagnosis not present

## 2015-12-14 DIAGNOSIS — I495 Sick sinus syndrome: Secondary | ICD-10-CM | POA: Diagnosis not present

## 2015-12-14 DIAGNOSIS — I10 Essential (primary) hypertension: Secondary | ICD-10-CM | POA: Diagnosis not present

## 2015-12-21 DIAGNOSIS — I25119 Atherosclerotic heart disease of native coronary artery with unspecified angina pectoris: Secondary | ICD-10-CM | POA: Diagnosis not present

## 2015-12-21 DIAGNOSIS — I10 Essential (primary) hypertension: Secondary | ICD-10-CM | POA: Diagnosis not present

## 2015-12-21 DIAGNOSIS — E784 Other hyperlipidemia: Secondary | ICD-10-CM | POA: Diagnosis not present

## 2015-12-21 DIAGNOSIS — M1993 Secondary osteoarthritis, unspecified site: Secondary | ICD-10-CM | POA: Diagnosis not present

## 2015-12-21 DIAGNOSIS — I495 Sick sinus syndrome: Secondary | ICD-10-CM | POA: Diagnosis not present

## 2015-12-21 DIAGNOSIS — R64 Cachexia: Secondary | ICD-10-CM | POA: Diagnosis not present

## 2015-12-28 DIAGNOSIS — M1993 Secondary osteoarthritis, unspecified site: Secondary | ICD-10-CM | POA: Diagnosis not present

## 2015-12-28 DIAGNOSIS — R64 Cachexia: Secondary | ICD-10-CM | POA: Diagnosis not present

## 2015-12-28 DIAGNOSIS — I495 Sick sinus syndrome: Secondary | ICD-10-CM | POA: Diagnosis not present

## 2015-12-28 DIAGNOSIS — I25119 Atherosclerotic heart disease of native coronary artery with unspecified angina pectoris: Secondary | ICD-10-CM | POA: Diagnosis not present

## 2015-12-28 DIAGNOSIS — E784 Other hyperlipidemia: Secondary | ICD-10-CM | POA: Diagnosis not present

## 2015-12-28 DIAGNOSIS — I10 Essential (primary) hypertension: Secondary | ICD-10-CM | POA: Diagnosis not present

## 2016-01-03 DIAGNOSIS — M1993 Secondary osteoarthritis, unspecified site: Secondary | ICD-10-CM | POA: Diagnosis not present

## 2016-01-03 DIAGNOSIS — E784 Other hyperlipidemia: Secondary | ICD-10-CM | POA: Diagnosis not present

## 2016-01-03 DIAGNOSIS — I25119 Atherosclerotic heart disease of native coronary artery with unspecified angina pectoris: Secondary | ICD-10-CM | POA: Diagnosis not present

## 2016-01-03 DIAGNOSIS — R64 Cachexia: Secondary | ICD-10-CM | POA: Diagnosis not present

## 2016-01-03 DIAGNOSIS — I495 Sick sinus syndrome: Secondary | ICD-10-CM | POA: Diagnosis not present

## 2016-01-03 DIAGNOSIS — I10 Essential (primary) hypertension: Secondary | ICD-10-CM | POA: Diagnosis not present

## 2016-01-05 DIAGNOSIS — R64 Cachexia: Secondary | ICD-10-CM | POA: Diagnosis not present

## 2016-01-05 DIAGNOSIS — I25119 Atherosclerotic heart disease of native coronary artery with unspecified angina pectoris: Secondary | ICD-10-CM | POA: Diagnosis not present

## 2016-01-05 DIAGNOSIS — M1993 Secondary osteoarthritis, unspecified site: Secondary | ICD-10-CM | POA: Diagnosis not present

## 2016-01-05 DIAGNOSIS — H409 Unspecified glaucoma: Secondary | ICD-10-CM | POA: Diagnosis not present

## 2016-01-05 DIAGNOSIS — I495 Sick sinus syndrome: Secondary | ICD-10-CM | POA: Diagnosis not present

## 2016-01-05 DIAGNOSIS — E784 Other hyperlipidemia: Secondary | ICD-10-CM | POA: Diagnosis not present

## 2016-01-05 DIAGNOSIS — I10 Essential (primary) hypertension: Secondary | ICD-10-CM | POA: Diagnosis not present

## 2016-01-08 DIAGNOSIS — I10 Essential (primary) hypertension: Secondary | ICD-10-CM | POA: Diagnosis not present

## 2016-01-08 DIAGNOSIS — I25119 Atherosclerotic heart disease of native coronary artery with unspecified angina pectoris: Secondary | ICD-10-CM | POA: Diagnosis not present

## 2016-01-08 DIAGNOSIS — I495 Sick sinus syndrome: Secondary | ICD-10-CM | POA: Diagnosis not present

## 2016-01-08 DIAGNOSIS — E784 Other hyperlipidemia: Secondary | ICD-10-CM | POA: Diagnosis not present

## 2016-01-08 DIAGNOSIS — M1993 Secondary osteoarthritis, unspecified site: Secondary | ICD-10-CM | POA: Diagnosis not present

## 2016-01-08 DIAGNOSIS — R64 Cachexia: Secondary | ICD-10-CM | POA: Diagnosis not present

## 2016-01-09 DIAGNOSIS — E784 Other hyperlipidemia: Secondary | ICD-10-CM | POA: Diagnosis not present

## 2016-01-09 DIAGNOSIS — M1993 Secondary osteoarthritis, unspecified site: Secondary | ICD-10-CM | POA: Diagnosis not present

## 2016-01-09 DIAGNOSIS — I495 Sick sinus syndrome: Secondary | ICD-10-CM | POA: Diagnosis not present

## 2016-01-09 DIAGNOSIS — I10 Essential (primary) hypertension: Secondary | ICD-10-CM | POA: Diagnosis not present

## 2016-01-09 DIAGNOSIS — R64 Cachexia: Secondary | ICD-10-CM | POA: Diagnosis not present

## 2016-01-09 DIAGNOSIS — I25119 Atherosclerotic heart disease of native coronary artery with unspecified angina pectoris: Secondary | ICD-10-CM | POA: Diagnosis not present

## 2016-01-17 DIAGNOSIS — R64 Cachexia: Secondary | ICD-10-CM | POA: Diagnosis not present

## 2016-01-17 DIAGNOSIS — I10 Essential (primary) hypertension: Secondary | ICD-10-CM | POA: Diagnosis not present

## 2016-01-17 DIAGNOSIS — I25119 Atherosclerotic heart disease of native coronary artery with unspecified angina pectoris: Secondary | ICD-10-CM | POA: Diagnosis not present

## 2016-01-17 DIAGNOSIS — E784 Other hyperlipidemia: Secondary | ICD-10-CM | POA: Diagnosis not present

## 2016-01-17 DIAGNOSIS — I495 Sick sinus syndrome: Secondary | ICD-10-CM | POA: Diagnosis not present

## 2016-01-17 DIAGNOSIS — M1993 Secondary osteoarthritis, unspecified site: Secondary | ICD-10-CM | POA: Diagnosis not present

## 2016-01-18 DIAGNOSIS — M1993 Secondary osteoarthritis, unspecified site: Secondary | ICD-10-CM | POA: Diagnosis not present

## 2016-01-18 DIAGNOSIS — R64 Cachexia: Secondary | ICD-10-CM | POA: Diagnosis not present

## 2016-01-18 DIAGNOSIS — I10 Essential (primary) hypertension: Secondary | ICD-10-CM | POA: Diagnosis not present

## 2016-01-18 DIAGNOSIS — E784 Other hyperlipidemia: Secondary | ICD-10-CM | POA: Diagnosis not present

## 2016-01-18 DIAGNOSIS — I25119 Atherosclerotic heart disease of native coronary artery with unspecified angina pectoris: Secondary | ICD-10-CM | POA: Diagnosis not present

## 2016-01-18 DIAGNOSIS — I495 Sick sinus syndrome: Secondary | ICD-10-CM | POA: Diagnosis not present

## 2016-01-24 DIAGNOSIS — I25119 Atherosclerotic heart disease of native coronary artery with unspecified angina pectoris: Secondary | ICD-10-CM | POA: Diagnosis not present

## 2016-01-24 DIAGNOSIS — R64 Cachexia: Secondary | ICD-10-CM | POA: Diagnosis not present

## 2016-01-24 DIAGNOSIS — I495 Sick sinus syndrome: Secondary | ICD-10-CM | POA: Diagnosis not present

## 2016-01-24 DIAGNOSIS — I10 Essential (primary) hypertension: Secondary | ICD-10-CM | POA: Diagnosis not present

## 2016-01-24 DIAGNOSIS — M1993 Secondary osteoarthritis, unspecified site: Secondary | ICD-10-CM | POA: Diagnosis not present

## 2016-01-24 DIAGNOSIS — E784 Other hyperlipidemia: Secondary | ICD-10-CM | POA: Diagnosis not present

## 2016-01-25 DIAGNOSIS — I10 Essential (primary) hypertension: Secondary | ICD-10-CM | POA: Diagnosis not present

## 2016-01-25 DIAGNOSIS — I495 Sick sinus syndrome: Secondary | ICD-10-CM | POA: Diagnosis not present

## 2016-01-25 DIAGNOSIS — I25119 Atherosclerotic heart disease of native coronary artery with unspecified angina pectoris: Secondary | ICD-10-CM | POA: Diagnosis not present

## 2016-01-25 DIAGNOSIS — E784 Other hyperlipidemia: Secondary | ICD-10-CM | POA: Diagnosis not present

## 2016-01-25 DIAGNOSIS — R64 Cachexia: Secondary | ICD-10-CM | POA: Diagnosis not present

## 2016-01-25 DIAGNOSIS — M1993 Secondary osteoarthritis, unspecified site: Secondary | ICD-10-CM | POA: Diagnosis not present

## 2016-01-31 DIAGNOSIS — I25119 Atherosclerotic heart disease of native coronary artery with unspecified angina pectoris: Secondary | ICD-10-CM | POA: Diagnosis not present

## 2016-01-31 DIAGNOSIS — R64 Cachexia: Secondary | ICD-10-CM | POA: Diagnosis not present

## 2016-01-31 DIAGNOSIS — I10 Essential (primary) hypertension: Secondary | ICD-10-CM | POA: Diagnosis not present

## 2016-01-31 DIAGNOSIS — E784 Other hyperlipidemia: Secondary | ICD-10-CM | POA: Diagnosis not present

## 2016-01-31 DIAGNOSIS — I495 Sick sinus syndrome: Secondary | ICD-10-CM | POA: Diagnosis not present

## 2016-01-31 DIAGNOSIS — M1993 Secondary osteoarthritis, unspecified site: Secondary | ICD-10-CM | POA: Diagnosis not present

## 2016-02-05 DIAGNOSIS — E784 Other hyperlipidemia: Secondary | ICD-10-CM | POA: Diagnosis not present

## 2016-02-05 DIAGNOSIS — H409 Unspecified glaucoma: Secondary | ICD-10-CM | POA: Diagnosis not present

## 2016-02-05 DIAGNOSIS — R64 Cachexia: Secondary | ICD-10-CM | POA: Diagnosis not present

## 2016-02-05 DIAGNOSIS — I10 Essential (primary) hypertension: Secondary | ICD-10-CM | POA: Diagnosis not present

## 2016-02-05 DIAGNOSIS — I25119 Atherosclerotic heart disease of native coronary artery with unspecified angina pectoris: Secondary | ICD-10-CM | POA: Diagnosis not present

## 2016-02-05 DIAGNOSIS — I495 Sick sinus syndrome: Secondary | ICD-10-CM | POA: Diagnosis not present

## 2016-02-05 DIAGNOSIS — M1993 Secondary osteoarthritis, unspecified site: Secondary | ICD-10-CM | POA: Diagnosis not present

## 2016-02-06 DIAGNOSIS — M1993 Secondary osteoarthritis, unspecified site: Secondary | ICD-10-CM | POA: Diagnosis not present

## 2016-02-06 DIAGNOSIS — I495 Sick sinus syndrome: Secondary | ICD-10-CM | POA: Diagnosis not present

## 2016-02-06 DIAGNOSIS — I10 Essential (primary) hypertension: Secondary | ICD-10-CM | POA: Diagnosis not present

## 2016-02-06 DIAGNOSIS — R64 Cachexia: Secondary | ICD-10-CM | POA: Diagnosis not present

## 2016-02-06 DIAGNOSIS — E784 Other hyperlipidemia: Secondary | ICD-10-CM | POA: Diagnosis not present

## 2016-02-06 DIAGNOSIS — I25119 Atherosclerotic heart disease of native coronary artery with unspecified angina pectoris: Secondary | ICD-10-CM | POA: Diagnosis not present

## 2016-02-10 DIAGNOSIS — E784 Other hyperlipidemia: Secondary | ICD-10-CM | POA: Diagnosis not present

## 2016-02-10 DIAGNOSIS — I25119 Atherosclerotic heart disease of native coronary artery with unspecified angina pectoris: Secondary | ICD-10-CM | POA: Diagnosis not present

## 2016-02-10 DIAGNOSIS — I495 Sick sinus syndrome: Secondary | ICD-10-CM | POA: Diagnosis not present

## 2016-02-10 DIAGNOSIS — M1993 Secondary osteoarthritis, unspecified site: Secondary | ICD-10-CM | POA: Diagnosis not present

## 2016-02-10 DIAGNOSIS — I10 Essential (primary) hypertension: Secondary | ICD-10-CM | POA: Diagnosis not present

## 2016-02-10 DIAGNOSIS — R64 Cachexia: Secondary | ICD-10-CM | POA: Diagnosis not present

## 2016-02-14 DIAGNOSIS — M1993 Secondary osteoarthritis, unspecified site: Secondary | ICD-10-CM | POA: Diagnosis not present

## 2016-02-14 DIAGNOSIS — E784 Other hyperlipidemia: Secondary | ICD-10-CM | POA: Diagnosis not present

## 2016-02-14 DIAGNOSIS — I495 Sick sinus syndrome: Secondary | ICD-10-CM | POA: Diagnosis not present

## 2016-02-14 DIAGNOSIS — R64 Cachexia: Secondary | ICD-10-CM | POA: Diagnosis not present

## 2016-02-14 DIAGNOSIS — I10 Essential (primary) hypertension: Secondary | ICD-10-CM | POA: Diagnosis not present

## 2016-02-14 DIAGNOSIS — I25119 Atherosclerotic heart disease of native coronary artery with unspecified angina pectoris: Secondary | ICD-10-CM | POA: Diagnosis not present

## 2016-02-20 DIAGNOSIS — E784 Other hyperlipidemia: Secondary | ICD-10-CM | POA: Diagnosis not present

## 2016-02-20 DIAGNOSIS — I495 Sick sinus syndrome: Secondary | ICD-10-CM | POA: Diagnosis not present

## 2016-02-20 DIAGNOSIS — I25119 Atherosclerotic heart disease of native coronary artery with unspecified angina pectoris: Secondary | ICD-10-CM | POA: Diagnosis not present

## 2016-02-20 DIAGNOSIS — I10 Essential (primary) hypertension: Secondary | ICD-10-CM | POA: Diagnosis not present

## 2016-02-20 DIAGNOSIS — M1993 Secondary osteoarthritis, unspecified site: Secondary | ICD-10-CM | POA: Diagnosis not present

## 2016-02-20 DIAGNOSIS — R64 Cachexia: Secondary | ICD-10-CM | POA: Diagnosis not present

## 2016-02-29 DIAGNOSIS — I25119 Atherosclerotic heart disease of native coronary artery with unspecified angina pectoris: Secondary | ICD-10-CM | POA: Diagnosis not present

## 2016-02-29 DIAGNOSIS — E784 Other hyperlipidemia: Secondary | ICD-10-CM | POA: Diagnosis not present

## 2016-02-29 DIAGNOSIS — R64 Cachexia: Secondary | ICD-10-CM | POA: Diagnosis not present

## 2016-02-29 DIAGNOSIS — I495 Sick sinus syndrome: Secondary | ICD-10-CM | POA: Diagnosis not present

## 2016-02-29 DIAGNOSIS — M1993 Secondary osteoarthritis, unspecified site: Secondary | ICD-10-CM | POA: Diagnosis not present

## 2016-02-29 DIAGNOSIS — I10 Essential (primary) hypertension: Secondary | ICD-10-CM | POA: Diagnosis not present

## 2016-03-05 DIAGNOSIS — M1993 Secondary osteoarthritis, unspecified site: Secondary | ICD-10-CM | POA: Diagnosis not present

## 2016-03-05 DIAGNOSIS — I25119 Atherosclerotic heart disease of native coronary artery with unspecified angina pectoris: Secondary | ICD-10-CM | POA: Diagnosis not present

## 2016-03-05 DIAGNOSIS — I10 Essential (primary) hypertension: Secondary | ICD-10-CM | POA: Diagnosis not present

## 2016-03-05 DIAGNOSIS — R64 Cachexia: Secondary | ICD-10-CM | POA: Diagnosis not present

## 2016-03-05 DIAGNOSIS — I495 Sick sinus syndrome: Secondary | ICD-10-CM | POA: Diagnosis not present

## 2016-03-05 DIAGNOSIS — E784 Other hyperlipidemia: Secondary | ICD-10-CM | POA: Diagnosis not present

## 2016-03-06 DIAGNOSIS — I25119 Atherosclerotic heart disease of native coronary artery with unspecified angina pectoris: Secondary | ICD-10-CM | POA: Diagnosis not present

## 2016-03-06 DIAGNOSIS — I495 Sick sinus syndrome: Secondary | ICD-10-CM | POA: Diagnosis not present

## 2016-03-06 DIAGNOSIS — M1993 Secondary osteoarthritis, unspecified site: Secondary | ICD-10-CM | POA: Diagnosis not present

## 2016-03-06 DIAGNOSIS — E784 Other hyperlipidemia: Secondary | ICD-10-CM | POA: Diagnosis not present

## 2016-03-06 DIAGNOSIS — R64 Cachexia: Secondary | ICD-10-CM | POA: Diagnosis not present

## 2016-03-06 DIAGNOSIS — I10 Essential (primary) hypertension: Secondary | ICD-10-CM | POA: Diagnosis not present

## 2016-03-07 DIAGNOSIS — I495 Sick sinus syndrome: Secondary | ICD-10-CM | POA: Diagnosis not present

## 2016-03-07 DIAGNOSIS — H409 Unspecified glaucoma: Secondary | ICD-10-CM | POA: Diagnosis not present

## 2016-03-07 DIAGNOSIS — I10 Essential (primary) hypertension: Secondary | ICD-10-CM | POA: Diagnosis not present

## 2016-03-07 DIAGNOSIS — R64 Cachexia: Secondary | ICD-10-CM | POA: Diagnosis not present

## 2016-03-07 DIAGNOSIS — I25119 Atherosclerotic heart disease of native coronary artery with unspecified angina pectoris: Secondary | ICD-10-CM | POA: Diagnosis not present

## 2016-03-07 DIAGNOSIS — E784 Other hyperlipidemia: Secondary | ICD-10-CM | POA: Diagnosis not present

## 2016-03-07 DIAGNOSIS — M1993 Secondary osteoarthritis, unspecified site: Secondary | ICD-10-CM | POA: Diagnosis not present

## 2016-03-09 DIAGNOSIS — I495 Sick sinus syndrome: Secondary | ICD-10-CM | POA: Diagnosis not present

## 2016-03-09 DIAGNOSIS — E784 Other hyperlipidemia: Secondary | ICD-10-CM | POA: Diagnosis not present

## 2016-03-09 DIAGNOSIS — R64 Cachexia: Secondary | ICD-10-CM | POA: Diagnosis not present

## 2016-03-09 DIAGNOSIS — I10 Essential (primary) hypertension: Secondary | ICD-10-CM | POA: Diagnosis not present

## 2016-03-09 DIAGNOSIS — I25119 Atherosclerotic heart disease of native coronary artery with unspecified angina pectoris: Secondary | ICD-10-CM | POA: Diagnosis not present

## 2016-03-09 DIAGNOSIS — M1993 Secondary osteoarthritis, unspecified site: Secondary | ICD-10-CM | POA: Diagnosis not present

## 2016-03-12 DIAGNOSIS — I25119 Atherosclerotic heart disease of native coronary artery with unspecified angina pectoris: Secondary | ICD-10-CM | POA: Diagnosis not present

## 2016-03-14 DIAGNOSIS — E784 Other hyperlipidemia: Secondary | ICD-10-CM | POA: Diagnosis not present

## 2016-03-14 DIAGNOSIS — I10 Essential (primary) hypertension: Secondary | ICD-10-CM | POA: Diagnosis not present

## 2016-03-14 DIAGNOSIS — M1993 Secondary osteoarthritis, unspecified site: Secondary | ICD-10-CM | POA: Diagnosis not present

## 2016-03-14 DIAGNOSIS — R64 Cachexia: Secondary | ICD-10-CM | POA: Diagnosis not present

## 2016-03-14 DIAGNOSIS — I25119 Atherosclerotic heart disease of native coronary artery with unspecified angina pectoris: Secondary | ICD-10-CM | POA: Diagnosis not present

## 2016-03-14 DIAGNOSIS — I495 Sick sinus syndrome: Secondary | ICD-10-CM | POA: Diagnosis not present

## 2016-03-17 DIAGNOSIS — I10 Essential (primary) hypertension: Secondary | ICD-10-CM | POA: Diagnosis not present

## 2016-03-17 DIAGNOSIS — R64 Cachexia: Secondary | ICD-10-CM | POA: Diagnosis not present

## 2016-03-17 DIAGNOSIS — E784 Other hyperlipidemia: Secondary | ICD-10-CM | POA: Diagnosis not present

## 2016-03-17 DIAGNOSIS — I25119 Atherosclerotic heart disease of native coronary artery with unspecified angina pectoris: Secondary | ICD-10-CM | POA: Diagnosis not present

## 2016-03-17 DIAGNOSIS — I495 Sick sinus syndrome: Secondary | ICD-10-CM | POA: Diagnosis not present

## 2016-03-17 DIAGNOSIS — M1993 Secondary osteoarthritis, unspecified site: Secondary | ICD-10-CM | POA: Diagnosis not present

## 2016-03-21 DIAGNOSIS — I495 Sick sinus syndrome: Secondary | ICD-10-CM | POA: Diagnosis not present

## 2016-03-21 DIAGNOSIS — E784 Other hyperlipidemia: Secondary | ICD-10-CM | POA: Diagnosis not present

## 2016-03-21 DIAGNOSIS — I25119 Atherosclerotic heart disease of native coronary artery with unspecified angina pectoris: Secondary | ICD-10-CM | POA: Diagnosis not present

## 2016-03-21 DIAGNOSIS — M1993 Secondary osteoarthritis, unspecified site: Secondary | ICD-10-CM | POA: Diagnosis not present

## 2016-03-21 DIAGNOSIS — I10 Essential (primary) hypertension: Secondary | ICD-10-CM | POA: Diagnosis not present

## 2016-03-21 DIAGNOSIS — R64 Cachexia: Secondary | ICD-10-CM | POA: Diagnosis not present

## 2016-03-27 DIAGNOSIS — M1993 Secondary osteoarthritis, unspecified site: Secondary | ICD-10-CM | POA: Diagnosis not present

## 2016-03-27 DIAGNOSIS — I495 Sick sinus syndrome: Secondary | ICD-10-CM | POA: Diagnosis not present

## 2016-03-27 DIAGNOSIS — I10 Essential (primary) hypertension: Secondary | ICD-10-CM | POA: Diagnosis not present

## 2016-03-27 DIAGNOSIS — R64 Cachexia: Secondary | ICD-10-CM | POA: Diagnosis not present

## 2016-03-27 DIAGNOSIS — I25119 Atherosclerotic heart disease of native coronary artery with unspecified angina pectoris: Secondary | ICD-10-CM | POA: Diagnosis not present

## 2016-03-27 DIAGNOSIS — E784 Other hyperlipidemia: Secondary | ICD-10-CM | POA: Diagnosis not present

## 2016-04-01 DIAGNOSIS — R64 Cachexia: Secondary | ICD-10-CM | POA: Diagnosis not present

## 2016-04-01 DIAGNOSIS — E784 Other hyperlipidemia: Secondary | ICD-10-CM | POA: Diagnosis not present

## 2016-04-01 DIAGNOSIS — M1993 Secondary osteoarthritis, unspecified site: Secondary | ICD-10-CM | POA: Diagnosis not present

## 2016-04-01 DIAGNOSIS — I495 Sick sinus syndrome: Secondary | ICD-10-CM | POA: Diagnosis not present

## 2016-04-01 DIAGNOSIS — I25119 Atherosclerotic heart disease of native coronary artery with unspecified angina pectoris: Secondary | ICD-10-CM | POA: Diagnosis not present

## 2016-04-01 DIAGNOSIS — I10 Essential (primary) hypertension: Secondary | ICD-10-CM | POA: Diagnosis not present

## 2016-04-02 DIAGNOSIS — I10 Essential (primary) hypertension: Secondary | ICD-10-CM | POA: Diagnosis not present

## 2016-04-02 DIAGNOSIS — I25119 Atherosclerotic heart disease of native coronary artery with unspecified angina pectoris: Secondary | ICD-10-CM | POA: Diagnosis not present

## 2016-04-02 DIAGNOSIS — I495 Sick sinus syndrome: Secondary | ICD-10-CM | POA: Diagnosis not present

## 2016-04-02 DIAGNOSIS — E784 Other hyperlipidemia: Secondary | ICD-10-CM | POA: Diagnosis not present

## 2016-04-02 DIAGNOSIS — M1993 Secondary osteoarthritis, unspecified site: Secondary | ICD-10-CM | POA: Diagnosis not present

## 2016-04-02 DIAGNOSIS — R64 Cachexia: Secondary | ICD-10-CM | POA: Diagnosis not present

## 2016-04-04 DIAGNOSIS — I119 Hypertensive heart disease without heart failure: Secondary | ICD-10-CM | POA: Diagnosis not present

## 2016-04-04 DIAGNOSIS — I208 Other forms of angina pectoris: Secondary | ICD-10-CM | POA: Diagnosis not present

## 2016-04-04 DIAGNOSIS — R001 Bradycardia, unspecified: Secondary | ICD-10-CM | POA: Diagnosis not present

## 2016-04-04 DIAGNOSIS — Z9861 Coronary angioplasty status: Secondary | ICD-10-CM | POA: Diagnosis not present

## 2016-04-04 DIAGNOSIS — I25119 Atherosclerotic heart disease of native coronary artery with unspecified angina pectoris: Secondary | ICD-10-CM | POA: Diagnosis not present

## 2016-04-04 DIAGNOSIS — E785 Hyperlipidemia, unspecified: Secondary | ICD-10-CM | POA: Diagnosis not present

## 2016-04-04 DIAGNOSIS — I872 Venous insufficiency (chronic) (peripheral): Secondary | ICD-10-CM | POA: Diagnosis not present

## 2016-04-04 DIAGNOSIS — D61818 Other pancytopenia: Secondary | ICD-10-CM | POA: Diagnosis not present

## 2016-04-06 DIAGNOSIS — I495 Sick sinus syndrome: Secondary | ICD-10-CM | POA: Diagnosis not present

## 2016-04-06 DIAGNOSIS — E784 Other hyperlipidemia: Secondary | ICD-10-CM | POA: Diagnosis not present

## 2016-04-06 DIAGNOSIS — I10 Essential (primary) hypertension: Secondary | ICD-10-CM | POA: Diagnosis not present

## 2016-04-06 DIAGNOSIS — H409 Unspecified glaucoma: Secondary | ICD-10-CM | POA: Diagnosis not present

## 2016-04-06 DIAGNOSIS — R64 Cachexia: Secondary | ICD-10-CM | POA: Diagnosis not present

## 2016-04-06 DIAGNOSIS — I25119 Atherosclerotic heart disease of native coronary artery with unspecified angina pectoris: Secondary | ICD-10-CM | POA: Diagnosis not present

## 2016-04-06 DIAGNOSIS — M1993 Secondary osteoarthritis, unspecified site: Secondary | ICD-10-CM | POA: Diagnosis not present

## 2016-04-09 DIAGNOSIS — I495 Sick sinus syndrome: Secondary | ICD-10-CM | POA: Diagnosis not present

## 2016-04-09 DIAGNOSIS — I10 Essential (primary) hypertension: Secondary | ICD-10-CM | POA: Diagnosis not present

## 2016-04-09 DIAGNOSIS — I25119 Atherosclerotic heart disease of native coronary artery with unspecified angina pectoris: Secondary | ICD-10-CM | POA: Diagnosis not present

## 2016-04-09 DIAGNOSIS — M1993 Secondary osteoarthritis, unspecified site: Secondary | ICD-10-CM | POA: Diagnosis not present

## 2016-04-09 DIAGNOSIS — E784 Other hyperlipidemia: Secondary | ICD-10-CM | POA: Diagnosis not present

## 2016-04-09 DIAGNOSIS — R64 Cachexia: Secondary | ICD-10-CM | POA: Diagnosis not present

## 2016-04-15 DIAGNOSIS — R64 Cachexia: Secondary | ICD-10-CM | POA: Diagnosis not present

## 2016-04-15 DIAGNOSIS — I10 Essential (primary) hypertension: Secondary | ICD-10-CM | POA: Diagnosis not present

## 2016-04-15 DIAGNOSIS — I25119 Atherosclerotic heart disease of native coronary artery with unspecified angina pectoris: Secondary | ICD-10-CM | POA: Diagnosis not present

## 2016-04-15 DIAGNOSIS — E784 Other hyperlipidemia: Secondary | ICD-10-CM | POA: Diagnosis not present

## 2016-04-15 DIAGNOSIS — M1993 Secondary osteoarthritis, unspecified site: Secondary | ICD-10-CM | POA: Diagnosis not present

## 2016-04-15 DIAGNOSIS — I495 Sick sinus syndrome: Secondary | ICD-10-CM | POA: Diagnosis not present

## 2016-04-16 DIAGNOSIS — I495 Sick sinus syndrome: Secondary | ICD-10-CM | POA: Diagnosis not present

## 2016-04-16 DIAGNOSIS — I25119 Atherosclerotic heart disease of native coronary artery with unspecified angina pectoris: Secondary | ICD-10-CM | POA: Diagnosis not present

## 2016-04-16 DIAGNOSIS — R64 Cachexia: Secondary | ICD-10-CM | POA: Diagnosis not present

## 2016-04-16 DIAGNOSIS — M1993 Secondary osteoarthritis, unspecified site: Secondary | ICD-10-CM | POA: Diagnosis not present

## 2016-04-16 DIAGNOSIS — I10 Essential (primary) hypertension: Secondary | ICD-10-CM | POA: Diagnosis not present

## 2016-04-16 DIAGNOSIS — E784 Other hyperlipidemia: Secondary | ICD-10-CM | POA: Diagnosis not present

## 2016-04-24 DIAGNOSIS — I25119 Atherosclerotic heart disease of native coronary artery with unspecified angina pectoris: Secondary | ICD-10-CM | POA: Diagnosis not present

## 2016-04-24 DIAGNOSIS — E784 Other hyperlipidemia: Secondary | ICD-10-CM | POA: Diagnosis not present

## 2016-04-24 DIAGNOSIS — M1993 Secondary osteoarthritis, unspecified site: Secondary | ICD-10-CM | POA: Diagnosis not present

## 2016-04-24 DIAGNOSIS — R64 Cachexia: Secondary | ICD-10-CM | POA: Diagnosis not present

## 2016-04-24 DIAGNOSIS — I10 Essential (primary) hypertension: Secondary | ICD-10-CM | POA: Diagnosis not present

## 2016-04-24 DIAGNOSIS — I495 Sick sinus syndrome: Secondary | ICD-10-CM | POA: Diagnosis not present

## 2016-05-01 DIAGNOSIS — I25119 Atherosclerotic heart disease of native coronary artery with unspecified angina pectoris: Secondary | ICD-10-CM | POA: Diagnosis not present

## 2016-05-01 DIAGNOSIS — R64 Cachexia: Secondary | ICD-10-CM | POA: Diagnosis not present

## 2016-05-01 DIAGNOSIS — M1993 Secondary osteoarthritis, unspecified site: Secondary | ICD-10-CM | POA: Diagnosis not present

## 2016-05-01 DIAGNOSIS — I495 Sick sinus syndrome: Secondary | ICD-10-CM | POA: Diagnosis not present

## 2016-05-01 DIAGNOSIS — I10 Essential (primary) hypertension: Secondary | ICD-10-CM | POA: Diagnosis not present

## 2016-05-01 DIAGNOSIS — E784 Other hyperlipidemia: Secondary | ICD-10-CM | POA: Diagnosis not present

## 2016-05-05 DIAGNOSIS — Z9861 Coronary angioplasty status: Secondary | ICD-10-CM | POA: Diagnosis not present

## 2016-05-05 DIAGNOSIS — I872 Venous insufficiency (chronic) (peripheral): Secondary | ICD-10-CM | POA: Diagnosis not present

## 2016-05-05 DIAGNOSIS — I25119 Atherosclerotic heart disease of native coronary artery with unspecified angina pectoris: Secondary | ICD-10-CM | POA: Diagnosis not present

## 2016-05-07 DIAGNOSIS — R64 Cachexia: Secondary | ICD-10-CM | POA: Diagnosis not present

## 2016-05-07 DIAGNOSIS — I25119 Atherosclerotic heart disease of native coronary artery with unspecified angina pectoris: Secondary | ICD-10-CM | POA: Diagnosis not present

## 2016-05-07 DIAGNOSIS — I10 Essential (primary) hypertension: Secondary | ICD-10-CM | POA: Diagnosis not present

## 2016-05-07 DIAGNOSIS — M1993 Secondary osteoarthritis, unspecified site: Secondary | ICD-10-CM | POA: Diagnosis not present

## 2016-05-07 DIAGNOSIS — H409 Unspecified glaucoma: Secondary | ICD-10-CM | POA: Diagnosis not present

## 2016-05-07 DIAGNOSIS — I495 Sick sinus syndrome: Secondary | ICD-10-CM | POA: Diagnosis not present

## 2016-05-07 DIAGNOSIS — E784 Other hyperlipidemia: Secondary | ICD-10-CM | POA: Diagnosis not present

## 2016-05-09 DIAGNOSIS — I495 Sick sinus syndrome: Secondary | ICD-10-CM | POA: Diagnosis not present

## 2016-05-09 DIAGNOSIS — R64 Cachexia: Secondary | ICD-10-CM | POA: Diagnosis not present

## 2016-05-09 DIAGNOSIS — E784 Other hyperlipidemia: Secondary | ICD-10-CM | POA: Diagnosis not present

## 2016-05-09 DIAGNOSIS — I10 Essential (primary) hypertension: Secondary | ICD-10-CM | POA: Diagnosis not present

## 2016-05-09 DIAGNOSIS — I25119 Atherosclerotic heart disease of native coronary artery with unspecified angina pectoris: Secondary | ICD-10-CM | POA: Diagnosis not present

## 2016-05-09 DIAGNOSIS — M1993 Secondary osteoarthritis, unspecified site: Secondary | ICD-10-CM | POA: Diagnosis not present

## 2016-05-13 DIAGNOSIS — M1993 Secondary osteoarthritis, unspecified site: Secondary | ICD-10-CM | POA: Diagnosis not present

## 2016-05-13 DIAGNOSIS — R64 Cachexia: Secondary | ICD-10-CM | POA: Diagnosis not present

## 2016-05-13 DIAGNOSIS — E784 Other hyperlipidemia: Secondary | ICD-10-CM | POA: Diagnosis not present

## 2016-05-13 DIAGNOSIS — I25119 Atherosclerotic heart disease of native coronary artery with unspecified angina pectoris: Secondary | ICD-10-CM | POA: Diagnosis not present

## 2016-05-13 DIAGNOSIS — I10 Essential (primary) hypertension: Secondary | ICD-10-CM | POA: Diagnosis not present

## 2016-05-13 DIAGNOSIS — I495 Sick sinus syndrome: Secondary | ICD-10-CM | POA: Diagnosis not present

## 2016-05-20 DIAGNOSIS — I495 Sick sinus syndrome: Secondary | ICD-10-CM | POA: Diagnosis not present

## 2016-05-20 DIAGNOSIS — I10 Essential (primary) hypertension: Secondary | ICD-10-CM | POA: Diagnosis not present

## 2016-05-20 DIAGNOSIS — M1993 Secondary osteoarthritis, unspecified site: Secondary | ICD-10-CM | POA: Diagnosis not present

## 2016-05-20 DIAGNOSIS — I25119 Atherosclerotic heart disease of native coronary artery with unspecified angina pectoris: Secondary | ICD-10-CM | POA: Diagnosis not present

## 2016-05-20 DIAGNOSIS — E784 Other hyperlipidemia: Secondary | ICD-10-CM | POA: Diagnosis not present

## 2016-05-20 DIAGNOSIS — R64 Cachexia: Secondary | ICD-10-CM | POA: Diagnosis not present

## 2016-05-22 DIAGNOSIS — I10 Essential (primary) hypertension: Secondary | ICD-10-CM | POA: Diagnosis not present

## 2016-05-22 DIAGNOSIS — I25119 Atherosclerotic heart disease of native coronary artery with unspecified angina pectoris: Secondary | ICD-10-CM | POA: Diagnosis not present

## 2016-05-22 DIAGNOSIS — M1993 Secondary osteoarthritis, unspecified site: Secondary | ICD-10-CM | POA: Diagnosis not present

## 2016-05-22 DIAGNOSIS — E784 Other hyperlipidemia: Secondary | ICD-10-CM | POA: Diagnosis not present

## 2016-05-22 DIAGNOSIS — I495 Sick sinus syndrome: Secondary | ICD-10-CM | POA: Diagnosis not present

## 2016-05-22 DIAGNOSIS — R64 Cachexia: Secondary | ICD-10-CM | POA: Diagnosis not present

## 2016-05-27 DIAGNOSIS — I495 Sick sinus syndrome: Secondary | ICD-10-CM | POA: Diagnosis not present

## 2016-05-27 DIAGNOSIS — I25119 Atherosclerotic heart disease of native coronary artery with unspecified angina pectoris: Secondary | ICD-10-CM | POA: Diagnosis not present

## 2016-05-27 DIAGNOSIS — E784 Other hyperlipidemia: Secondary | ICD-10-CM | POA: Diagnosis not present

## 2016-05-27 DIAGNOSIS — I10 Essential (primary) hypertension: Secondary | ICD-10-CM | POA: Diagnosis not present

## 2016-05-27 DIAGNOSIS — R64 Cachexia: Secondary | ICD-10-CM | POA: Diagnosis not present

## 2016-05-27 DIAGNOSIS — M1993 Secondary osteoarthritis, unspecified site: Secondary | ICD-10-CM | POA: Diagnosis not present

## 2016-05-28 DIAGNOSIS — I495 Sick sinus syndrome: Secondary | ICD-10-CM | POA: Diagnosis not present

## 2016-05-28 DIAGNOSIS — R64 Cachexia: Secondary | ICD-10-CM | POA: Diagnosis not present

## 2016-05-28 DIAGNOSIS — E784 Other hyperlipidemia: Secondary | ICD-10-CM | POA: Diagnosis not present

## 2016-05-28 DIAGNOSIS — I10 Essential (primary) hypertension: Secondary | ICD-10-CM | POA: Diagnosis not present

## 2016-05-28 DIAGNOSIS — I25119 Atherosclerotic heart disease of native coronary artery with unspecified angina pectoris: Secondary | ICD-10-CM | POA: Diagnosis not present

## 2016-05-28 DIAGNOSIS — M1993 Secondary osteoarthritis, unspecified site: Secondary | ICD-10-CM | POA: Diagnosis not present

## 2016-06-05 DIAGNOSIS — E784 Other hyperlipidemia: Secondary | ICD-10-CM | POA: Diagnosis not present

## 2016-06-05 DIAGNOSIS — I495 Sick sinus syndrome: Secondary | ICD-10-CM | POA: Diagnosis not present

## 2016-06-05 DIAGNOSIS — R64 Cachexia: Secondary | ICD-10-CM | POA: Diagnosis not present

## 2016-06-05 DIAGNOSIS — I10 Essential (primary) hypertension: Secondary | ICD-10-CM | POA: Diagnosis not present

## 2016-06-05 DIAGNOSIS — I25119 Atherosclerotic heart disease of native coronary artery with unspecified angina pectoris: Secondary | ICD-10-CM | POA: Diagnosis not present

## 2016-06-05 DIAGNOSIS — M1993 Secondary osteoarthritis, unspecified site: Secondary | ICD-10-CM | POA: Diagnosis not present

## 2016-06-06 DIAGNOSIS — I10 Essential (primary) hypertension: Secondary | ICD-10-CM | POA: Diagnosis not present

## 2016-06-06 DIAGNOSIS — H409 Unspecified glaucoma: Secondary | ICD-10-CM | POA: Diagnosis not present

## 2016-06-06 DIAGNOSIS — I25119 Atherosclerotic heart disease of native coronary artery with unspecified angina pectoris: Secondary | ICD-10-CM | POA: Diagnosis not present

## 2016-06-06 DIAGNOSIS — M1993 Secondary osteoarthritis, unspecified site: Secondary | ICD-10-CM | POA: Diagnosis not present

## 2016-06-06 DIAGNOSIS — R64 Cachexia: Secondary | ICD-10-CM | POA: Diagnosis not present

## 2016-06-06 DIAGNOSIS — I495 Sick sinus syndrome: Secondary | ICD-10-CM | POA: Diagnosis not present

## 2016-06-06 DIAGNOSIS — E784 Other hyperlipidemia: Secondary | ICD-10-CM | POA: Diagnosis not present

## 2016-06-09 DIAGNOSIS — R64 Cachexia: Secondary | ICD-10-CM | POA: Diagnosis not present

## 2016-06-09 DIAGNOSIS — I25119 Atherosclerotic heart disease of native coronary artery with unspecified angina pectoris: Secondary | ICD-10-CM | POA: Diagnosis not present

## 2016-06-09 DIAGNOSIS — M1993 Secondary osteoarthritis, unspecified site: Secondary | ICD-10-CM | POA: Diagnosis not present

## 2016-06-09 DIAGNOSIS — E784 Other hyperlipidemia: Secondary | ICD-10-CM | POA: Diagnosis not present

## 2016-06-09 DIAGNOSIS — I10 Essential (primary) hypertension: Secondary | ICD-10-CM | POA: Diagnosis not present

## 2016-06-09 DIAGNOSIS — I495 Sick sinus syndrome: Secondary | ICD-10-CM | POA: Diagnosis not present

## 2016-06-19 DIAGNOSIS — M1993 Secondary osteoarthritis, unspecified site: Secondary | ICD-10-CM | POA: Diagnosis not present

## 2016-06-19 DIAGNOSIS — I25119 Atherosclerotic heart disease of native coronary artery with unspecified angina pectoris: Secondary | ICD-10-CM | POA: Diagnosis not present

## 2016-06-19 DIAGNOSIS — I495 Sick sinus syndrome: Secondary | ICD-10-CM | POA: Diagnosis not present

## 2016-06-19 DIAGNOSIS — E784 Other hyperlipidemia: Secondary | ICD-10-CM | POA: Diagnosis not present

## 2016-06-19 DIAGNOSIS — R64 Cachexia: Secondary | ICD-10-CM | POA: Diagnosis not present

## 2016-06-19 DIAGNOSIS — I10 Essential (primary) hypertension: Secondary | ICD-10-CM | POA: Diagnosis not present

## 2016-06-24 DIAGNOSIS — E784 Other hyperlipidemia: Secondary | ICD-10-CM | POA: Diagnosis not present

## 2016-06-24 DIAGNOSIS — I10 Essential (primary) hypertension: Secondary | ICD-10-CM | POA: Diagnosis not present

## 2016-06-24 DIAGNOSIS — M1993 Secondary osteoarthritis, unspecified site: Secondary | ICD-10-CM | POA: Diagnosis not present

## 2016-06-24 DIAGNOSIS — I25119 Atherosclerotic heart disease of native coronary artery with unspecified angina pectoris: Secondary | ICD-10-CM | POA: Diagnosis not present

## 2016-06-24 DIAGNOSIS — R64 Cachexia: Secondary | ICD-10-CM | POA: Diagnosis not present

## 2016-06-24 DIAGNOSIS — I495 Sick sinus syndrome: Secondary | ICD-10-CM | POA: Diagnosis not present

## 2016-07-03 DIAGNOSIS — E784 Other hyperlipidemia: Secondary | ICD-10-CM | POA: Diagnosis not present

## 2016-07-03 DIAGNOSIS — I10 Essential (primary) hypertension: Secondary | ICD-10-CM | POA: Diagnosis not present

## 2016-07-03 DIAGNOSIS — R64 Cachexia: Secondary | ICD-10-CM | POA: Diagnosis not present

## 2016-07-03 DIAGNOSIS — M1993 Secondary osteoarthritis, unspecified site: Secondary | ICD-10-CM | POA: Diagnosis not present

## 2016-07-03 DIAGNOSIS — I25119 Atherosclerotic heart disease of native coronary artery with unspecified angina pectoris: Secondary | ICD-10-CM | POA: Diagnosis not present

## 2016-07-03 DIAGNOSIS — I495 Sick sinus syndrome: Secondary | ICD-10-CM | POA: Diagnosis not present

## 2016-07-07 DIAGNOSIS — M1993 Secondary osteoarthritis, unspecified site: Secondary | ICD-10-CM | POA: Diagnosis not present

## 2016-07-07 DIAGNOSIS — H409 Unspecified glaucoma: Secondary | ICD-10-CM | POA: Diagnosis not present

## 2016-07-07 DIAGNOSIS — R64 Cachexia: Secondary | ICD-10-CM | POA: Diagnosis not present

## 2016-07-07 DIAGNOSIS — E784 Other hyperlipidemia: Secondary | ICD-10-CM | POA: Diagnosis not present

## 2016-07-07 DIAGNOSIS — I495 Sick sinus syndrome: Secondary | ICD-10-CM | POA: Diagnosis not present

## 2016-07-07 DIAGNOSIS — I10 Essential (primary) hypertension: Secondary | ICD-10-CM | POA: Diagnosis not present

## 2016-07-07 DIAGNOSIS — I25119 Atherosclerotic heart disease of native coronary artery with unspecified angina pectoris: Secondary | ICD-10-CM | POA: Diagnosis not present

## 2016-07-08 DIAGNOSIS — I872 Venous insufficiency (chronic) (peripheral): Secondary | ICD-10-CM | POA: Diagnosis not present

## 2016-07-08 DIAGNOSIS — I25119 Atherosclerotic heart disease of native coronary artery with unspecified angina pectoris: Secondary | ICD-10-CM | POA: Diagnosis not present

## 2016-07-08 DIAGNOSIS — Z9861 Coronary angioplasty status: Secondary | ICD-10-CM | POA: Diagnosis not present

## 2016-07-09 DIAGNOSIS — R64 Cachexia: Secondary | ICD-10-CM | POA: Diagnosis not present

## 2016-07-09 DIAGNOSIS — I495 Sick sinus syndrome: Secondary | ICD-10-CM | POA: Diagnosis not present

## 2016-07-09 DIAGNOSIS — E784 Other hyperlipidemia: Secondary | ICD-10-CM | POA: Diagnosis not present

## 2016-07-09 DIAGNOSIS — I10 Essential (primary) hypertension: Secondary | ICD-10-CM | POA: Diagnosis not present

## 2016-07-09 DIAGNOSIS — I25119 Atherosclerotic heart disease of native coronary artery with unspecified angina pectoris: Secondary | ICD-10-CM | POA: Diagnosis not present

## 2016-07-09 DIAGNOSIS — M1993 Secondary osteoarthritis, unspecified site: Secondary | ICD-10-CM | POA: Diagnosis not present

## 2016-07-14 DIAGNOSIS — M1993 Secondary osteoarthritis, unspecified site: Secondary | ICD-10-CM | POA: Diagnosis not present

## 2016-07-14 DIAGNOSIS — I25119 Atherosclerotic heart disease of native coronary artery with unspecified angina pectoris: Secondary | ICD-10-CM | POA: Diagnosis not present

## 2016-07-14 DIAGNOSIS — E784 Other hyperlipidemia: Secondary | ICD-10-CM | POA: Diagnosis not present

## 2016-07-14 DIAGNOSIS — I495 Sick sinus syndrome: Secondary | ICD-10-CM | POA: Diagnosis not present

## 2016-07-14 DIAGNOSIS — R64 Cachexia: Secondary | ICD-10-CM | POA: Diagnosis not present

## 2016-07-14 DIAGNOSIS — I10 Essential (primary) hypertension: Secondary | ICD-10-CM | POA: Diagnosis not present

## 2016-07-23 DIAGNOSIS — I10 Essential (primary) hypertension: Secondary | ICD-10-CM | POA: Diagnosis not present

## 2016-07-23 DIAGNOSIS — I25119 Atherosclerotic heart disease of native coronary artery with unspecified angina pectoris: Secondary | ICD-10-CM | POA: Diagnosis not present

## 2016-07-23 DIAGNOSIS — M1993 Secondary osteoarthritis, unspecified site: Secondary | ICD-10-CM | POA: Diagnosis not present

## 2016-07-23 DIAGNOSIS — E784 Other hyperlipidemia: Secondary | ICD-10-CM | POA: Diagnosis not present

## 2016-07-23 DIAGNOSIS — I495 Sick sinus syndrome: Secondary | ICD-10-CM | POA: Diagnosis not present

## 2016-07-23 DIAGNOSIS — R64 Cachexia: Secondary | ICD-10-CM | POA: Diagnosis not present

## 2016-07-25 DIAGNOSIS — I10 Essential (primary) hypertension: Secondary | ICD-10-CM | POA: Diagnosis not present

## 2016-07-25 DIAGNOSIS — I25119 Atherosclerotic heart disease of native coronary artery with unspecified angina pectoris: Secondary | ICD-10-CM | POA: Diagnosis not present

## 2016-07-25 DIAGNOSIS — R64 Cachexia: Secondary | ICD-10-CM | POA: Diagnosis not present

## 2016-07-25 DIAGNOSIS — I495 Sick sinus syndrome: Secondary | ICD-10-CM | POA: Diagnosis not present

## 2016-07-25 DIAGNOSIS — E784 Other hyperlipidemia: Secondary | ICD-10-CM | POA: Diagnosis not present

## 2016-07-25 DIAGNOSIS — M1993 Secondary osteoarthritis, unspecified site: Secondary | ICD-10-CM | POA: Diagnosis not present

## 2016-07-31 DIAGNOSIS — I10 Essential (primary) hypertension: Secondary | ICD-10-CM | POA: Diagnosis not present

## 2016-07-31 DIAGNOSIS — I25119 Atherosclerotic heart disease of native coronary artery with unspecified angina pectoris: Secondary | ICD-10-CM | POA: Diagnosis not present

## 2016-07-31 DIAGNOSIS — E784 Other hyperlipidemia: Secondary | ICD-10-CM | POA: Diagnosis not present

## 2016-07-31 DIAGNOSIS — R64 Cachexia: Secondary | ICD-10-CM | POA: Diagnosis not present

## 2016-07-31 DIAGNOSIS — M1993 Secondary osteoarthritis, unspecified site: Secondary | ICD-10-CM | POA: Diagnosis not present

## 2016-07-31 DIAGNOSIS — I495 Sick sinus syndrome: Secondary | ICD-10-CM | POA: Diagnosis not present

## 2016-08-06 DIAGNOSIS — I10 Essential (primary) hypertension: Secondary | ICD-10-CM | POA: Diagnosis not present

## 2016-08-06 DIAGNOSIS — I25119 Atherosclerotic heart disease of native coronary artery with unspecified angina pectoris: Secondary | ICD-10-CM | POA: Diagnosis not present

## 2016-08-06 DIAGNOSIS — E784 Other hyperlipidemia: Secondary | ICD-10-CM | POA: Diagnosis not present

## 2016-08-06 DIAGNOSIS — I495 Sick sinus syndrome: Secondary | ICD-10-CM | POA: Diagnosis not present

## 2016-08-06 DIAGNOSIS — R64 Cachexia: Secondary | ICD-10-CM | POA: Diagnosis not present

## 2016-08-06 DIAGNOSIS — M1993 Secondary osteoarthritis, unspecified site: Secondary | ICD-10-CM | POA: Diagnosis not present

## 2016-08-07 DIAGNOSIS — H409 Unspecified glaucoma: Secondary | ICD-10-CM | POA: Diagnosis not present

## 2016-08-07 DIAGNOSIS — I25119 Atherosclerotic heart disease of native coronary artery with unspecified angina pectoris: Secondary | ICD-10-CM | POA: Diagnosis not present

## 2016-08-07 DIAGNOSIS — I495 Sick sinus syndrome: Secondary | ICD-10-CM | POA: Diagnosis not present

## 2016-08-07 DIAGNOSIS — I10 Essential (primary) hypertension: Secondary | ICD-10-CM | POA: Diagnosis not present

## 2016-08-07 DIAGNOSIS — E784 Other hyperlipidemia: Secondary | ICD-10-CM | POA: Diagnosis not present

## 2016-08-07 DIAGNOSIS — R64 Cachexia: Secondary | ICD-10-CM | POA: Diagnosis not present

## 2016-08-07 DIAGNOSIS — M1993 Secondary osteoarthritis, unspecified site: Secondary | ICD-10-CM | POA: Diagnosis not present

## 2016-08-13 DIAGNOSIS — R64 Cachexia: Secondary | ICD-10-CM | POA: Diagnosis not present

## 2016-08-13 DIAGNOSIS — I10 Essential (primary) hypertension: Secondary | ICD-10-CM | POA: Diagnosis not present

## 2016-08-13 DIAGNOSIS — E784 Other hyperlipidemia: Secondary | ICD-10-CM | POA: Diagnosis not present

## 2016-08-13 DIAGNOSIS — I25119 Atherosclerotic heart disease of native coronary artery with unspecified angina pectoris: Secondary | ICD-10-CM | POA: Diagnosis not present

## 2016-08-13 DIAGNOSIS — M1993 Secondary osteoarthritis, unspecified site: Secondary | ICD-10-CM | POA: Diagnosis not present

## 2016-08-13 DIAGNOSIS — I495 Sick sinus syndrome: Secondary | ICD-10-CM | POA: Diagnosis not present

## 2016-08-14 DIAGNOSIS — I25119 Atherosclerotic heart disease of native coronary artery with unspecified angina pectoris: Secondary | ICD-10-CM | POA: Diagnosis not present

## 2016-08-14 DIAGNOSIS — M1993 Secondary osteoarthritis, unspecified site: Secondary | ICD-10-CM | POA: Diagnosis not present

## 2016-08-14 DIAGNOSIS — I10 Essential (primary) hypertension: Secondary | ICD-10-CM | POA: Diagnosis not present

## 2016-08-14 DIAGNOSIS — E784 Other hyperlipidemia: Secondary | ICD-10-CM | POA: Diagnosis not present

## 2016-08-14 DIAGNOSIS — R64 Cachexia: Secondary | ICD-10-CM | POA: Diagnosis not present

## 2016-08-14 DIAGNOSIS — I495 Sick sinus syndrome: Secondary | ICD-10-CM | POA: Diagnosis not present

## 2016-08-21 DIAGNOSIS — M1993 Secondary osteoarthritis, unspecified site: Secondary | ICD-10-CM | POA: Diagnosis not present

## 2016-08-21 DIAGNOSIS — R64 Cachexia: Secondary | ICD-10-CM | POA: Diagnosis not present

## 2016-08-21 DIAGNOSIS — I495 Sick sinus syndrome: Secondary | ICD-10-CM | POA: Diagnosis not present

## 2016-08-21 DIAGNOSIS — I25119 Atherosclerotic heart disease of native coronary artery with unspecified angina pectoris: Secondary | ICD-10-CM | POA: Diagnosis not present

## 2016-08-21 DIAGNOSIS — E784 Other hyperlipidemia: Secondary | ICD-10-CM | POA: Diagnosis not present

## 2016-08-21 DIAGNOSIS — I10 Essential (primary) hypertension: Secondary | ICD-10-CM | POA: Diagnosis not present

## 2016-08-27 DIAGNOSIS — E784 Other hyperlipidemia: Secondary | ICD-10-CM | POA: Diagnosis not present

## 2016-08-27 DIAGNOSIS — M1993 Secondary osteoarthritis, unspecified site: Secondary | ICD-10-CM | POA: Diagnosis not present

## 2016-08-27 DIAGNOSIS — I10 Essential (primary) hypertension: Secondary | ICD-10-CM | POA: Diagnosis not present

## 2016-08-27 DIAGNOSIS — R64 Cachexia: Secondary | ICD-10-CM | POA: Diagnosis not present

## 2016-08-27 DIAGNOSIS — I495 Sick sinus syndrome: Secondary | ICD-10-CM | POA: Diagnosis not present

## 2016-08-27 DIAGNOSIS — I25119 Atherosclerotic heart disease of native coronary artery with unspecified angina pectoris: Secondary | ICD-10-CM | POA: Diagnosis not present

## 2016-08-28 DIAGNOSIS — I495 Sick sinus syndrome: Secondary | ICD-10-CM | POA: Diagnosis not present

## 2016-08-28 DIAGNOSIS — M1993 Secondary osteoarthritis, unspecified site: Secondary | ICD-10-CM | POA: Diagnosis not present

## 2016-08-28 DIAGNOSIS — I25119 Atherosclerotic heart disease of native coronary artery with unspecified angina pectoris: Secondary | ICD-10-CM | POA: Diagnosis not present

## 2016-08-28 DIAGNOSIS — R64 Cachexia: Secondary | ICD-10-CM | POA: Diagnosis not present

## 2016-08-28 DIAGNOSIS — I10 Essential (primary) hypertension: Secondary | ICD-10-CM | POA: Diagnosis not present

## 2016-08-28 DIAGNOSIS — E784 Other hyperlipidemia: Secondary | ICD-10-CM | POA: Diagnosis not present

## 2016-09-02 DIAGNOSIS — E784 Other hyperlipidemia: Secondary | ICD-10-CM | POA: Diagnosis not present

## 2016-09-02 DIAGNOSIS — I25119 Atherosclerotic heart disease of native coronary artery with unspecified angina pectoris: Secondary | ICD-10-CM | POA: Diagnosis not present

## 2016-09-02 DIAGNOSIS — M1993 Secondary osteoarthritis, unspecified site: Secondary | ICD-10-CM | POA: Diagnosis not present

## 2016-09-02 DIAGNOSIS — R64 Cachexia: Secondary | ICD-10-CM | POA: Diagnosis not present

## 2016-09-02 DIAGNOSIS — I10 Essential (primary) hypertension: Secondary | ICD-10-CM | POA: Diagnosis not present

## 2016-09-02 DIAGNOSIS — I495 Sick sinus syndrome: Secondary | ICD-10-CM | POA: Diagnosis not present

## 2016-09-04 DIAGNOSIS — I25119 Atherosclerotic heart disease of native coronary artery with unspecified angina pectoris: Secondary | ICD-10-CM | POA: Diagnosis not present

## 2016-09-04 DIAGNOSIS — I10 Essential (primary) hypertension: Secondary | ICD-10-CM | POA: Diagnosis not present

## 2016-09-04 DIAGNOSIS — R64 Cachexia: Secondary | ICD-10-CM | POA: Diagnosis not present

## 2016-09-04 DIAGNOSIS — I495 Sick sinus syndrome: Secondary | ICD-10-CM | POA: Diagnosis not present

## 2016-09-04 DIAGNOSIS — E784 Other hyperlipidemia: Secondary | ICD-10-CM | POA: Diagnosis not present

## 2016-09-04 DIAGNOSIS — M1993 Secondary osteoarthritis, unspecified site: Secondary | ICD-10-CM | POA: Diagnosis not present

## 2016-09-04 DIAGNOSIS — H409 Unspecified glaucoma: Secondary | ICD-10-CM | POA: Diagnosis not present

## 2016-09-09 DIAGNOSIS — R64 Cachexia: Secondary | ICD-10-CM | POA: Diagnosis not present

## 2016-09-09 DIAGNOSIS — I25119 Atherosclerotic heart disease of native coronary artery with unspecified angina pectoris: Secondary | ICD-10-CM | POA: Diagnosis not present

## 2016-09-09 DIAGNOSIS — I10 Essential (primary) hypertension: Secondary | ICD-10-CM | POA: Diagnosis not present

## 2016-09-09 DIAGNOSIS — M1993 Secondary osteoarthritis, unspecified site: Secondary | ICD-10-CM | POA: Diagnosis not present

## 2016-09-09 DIAGNOSIS — E784 Other hyperlipidemia: Secondary | ICD-10-CM | POA: Diagnosis not present

## 2016-09-09 DIAGNOSIS — I495 Sick sinus syndrome: Secondary | ICD-10-CM | POA: Diagnosis not present

## 2016-09-16 DIAGNOSIS — R64 Cachexia: Secondary | ICD-10-CM | POA: Diagnosis not present

## 2016-09-16 DIAGNOSIS — I25119 Atherosclerotic heart disease of native coronary artery with unspecified angina pectoris: Secondary | ICD-10-CM | POA: Diagnosis not present

## 2016-09-16 DIAGNOSIS — I495 Sick sinus syndrome: Secondary | ICD-10-CM | POA: Diagnosis not present

## 2016-09-16 DIAGNOSIS — M1993 Secondary osteoarthritis, unspecified site: Secondary | ICD-10-CM | POA: Diagnosis not present

## 2016-09-16 DIAGNOSIS — E784 Other hyperlipidemia: Secondary | ICD-10-CM | POA: Diagnosis not present

## 2016-09-16 DIAGNOSIS — I10 Essential (primary) hypertension: Secondary | ICD-10-CM | POA: Diagnosis not present

## 2016-09-17 DIAGNOSIS — I25119 Atherosclerotic heart disease of native coronary artery with unspecified angina pectoris: Secondary | ICD-10-CM | POA: Diagnosis not present

## 2016-09-17 DIAGNOSIS — M1993 Secondary osteoarthritis, unspecified site: Secondary | ICD-10-CM | POA: Diagnosis not present

## 2016-09-17 DIAGNOSIS — E784 Other hyperlipidemia: Secondary | ICD-10-CM | POA: Diagnosis not present

## 2016-09-17 DIAGNOSIS — R64 Cachexia: Secondary | ICD-10-CM | POA: Diagnosis not present

## 2016-09-17 DIAGNOSIS — I495 Sick sinus syndrome: Secondary | ICD-10-CM | POA: Diagnosis not present

## 2016-09-17 DIAGNOSIS — I10 Essential (primary) hypertension: Secondary | ICD-10-CM | POA: Diagnosis not present

## 2016-09-23 DIAGNOSIS — R64 Cachexia: Secondary | ICD-10-CM | POA: Diagnosis not present

## 2016-09-23 DIAGNOSIS — I495 Sick sinus syndrome: Secondary | ICD-10-CM | POA: Diagnosis not present

## 2016-09-23 DIAGNOSIS — E784 Other hyperlipidemia: Secondary | ICD-10-CM | POA: Diagnosis not present

## 2016-09-23 DIAGNOSIS — I25119 Atherosclerotic heart disease of native coronary artery with unspecified angina pectoris: Secondary | ICD-10-CM | POA: Diagnosis not present

## 2016-09-23 DIAGNOSIS — M1993 Secondary osteoarthritis, unspecified site: Secondary | ICD-10-CM | POA: Diagnosis not present

## 2016-09-23 DIAGNOSIS — I10 Essential (primary) hypertension: Secondary | ICD-10-CM | POA: Diagnosis not present

## 2016-09-24 DIAGNOSIS — I25119 Atherosclerotic heart disease of native coronary artery with unspecified angina pectoris: Secondary | ICD-10-CM | POA: Diagnosis not present

## 2016-09-24 DIAGNOSIS — I872 Venous insufficiency (chronic) (peripheral): Secondary | ICD-10-CM | POA: Diagnosis not present

## 2016-09-24 DIAGNOSIS — Z9861 Coronary angioplasty status: Secondary | ICD-10-CM | POA: Diagnosis not present

## 2016-09-26 DIAGNOSIS — M1993 Secondary osteoarthritis, unspecified site: Secondary | ICD-10-CM | POA: Diagnosis not present

## 2016-09-26 DIAGNOSIS — E784 Other hyperlipidemia: Secondary | ICD-10-CM | POA: Diagnosis not present

## 2016-09-26 DIAGNOSIS — I495 Sick sinus syndrome: Secondary | ICD-10-CM | POA: Diagnosis not present

## 2016-09-26 DIAGNOSIS — R64 Cachexia: Secondary | ICD-10-CM | POA: Diagnosis not present

## 2016-09-26 DIAGNOSIS — I10 Essential (primary) hypertension: Secondary | ICD-10-CM | POA: Diagnosis not present

## 2016-09-26 DIAGNOSIS — I25119 Atherosclerotic heart disease of native coronary artery with unspecified angina pectoris: Secondary | ICD-10-CM | POA: Diagnosis not present

## 2016-09-30 DIAGNOSIS — R64 Cachexia: Secondary | ICD-10-CM | POA: Diagnosis not present

## 2016-09-30 DIAGNOSIS — E784 Other hyperlipidemia: Secondary | ICD-10-CM | POA: Diagnosis not present

## 2016-09-30 DIAGNOSIS — I25119 Atherosclerotic heart disease of native coronary artery with unspecified angina pectoris: Secondary | ICD-10-CM | POA: Diagnosis not present

## 2016-09-30 DIAGNOSIS — I495 Sick sinus syndrome: Secondary | ICD-10-CM | POA: Diagnosis not present

## 2016-09-30 DIAGNOSIS — I10 Essential (primary) hypertension: Secondary | ICD-10-CM | POA: Diagnosis not present

## 2016-09-30 DIAGNOSIS — M1993 Secondary osteoarthritis, unspecified site: Secondary | ICD-10-CM | POA: Diagnosis not present

## 2016-10-05 DIAGNOSIS — I10 Essential (primary) hypertension: Secondary | ICD-10-CM | POA: Diagnosis not present

## 2016-10-05 DIAGNOSIS — M1993 Secondary osteoarthritis, unspecified site: Secondary | ICD-10-CM | POA: Diagnosis not present

## 2016-10-05 DIAGNOSIS — H409 Unspecified glaucoma: Secondary | ICD-10-CM | POA: Diagnosis not present

## 2016-10-05 DIAGNOSIS — E784 Other hyperlipidemia: Secondary | ICD-10-CM | POA: Diagnosis not present

## 2016-10-05 DIAGNOSIS — R64 Cachexia: Secondary | ICD-10-CM | POA: Diagnosis not present

## 2016-10-05 DIAGNOSIS — I495 Sick sinus syndrome: Secondary | ICD-10-CM | POA: Diagnosis not present

## 2016-10-05 DIAGNOSIS — I25119 Atherosclerotic heart disease of native coronary artery with unspecified angina pectoris: Secondary | ICD-10-CM | POA: Diagnosis not present

## 2016-10-07 DIAGNOSIS — Z9861 Coronary angioplasty status: Secondary | ICD-10-CM | POA: Diagnosis not present

## 2016-10-07 DIAGNOSIS — R64 Cachexia: Secondary | ICD-10-CM | POA: Diagnosis not present

## 2016-10-07 DIAGNOSIS — I872 Venous insufficiency (chronic) (peripheral): Secondary | ICD-10-CM | POA: Diagnosis not present

## 2016-10-07 DIAGNOSIS — E784 Other hyperlipidemia: Secondary | ICD-10-CM | POA: Diagnosis not present

## 2016-10-07 DIAGNOSIS — D61818 Other pancytopenia: Secondary | ICD-10-CM | POA: Diagnosis not present

## 2016-10-07 DIAGNOSIS — E785 Hyperlipidemia, unspecified: Secondary | ICD-10-CM | POA: Diagnosis not present

## 2016-10-07 DIAGNOSIS — I495 Sick sinus syndrome: Secondary | ICD-10-CM | POA: Diagnosis not present

## 2016-10-07 DIAGNOSIS — R001 Bradycardia, unspecified: Secondary | ICD-10-CM | POA: Diagnosis not present

## 2016-10-07 DIAGNOSIS — M1993 Secondary osteoarthritis, unspecified site: Secondary | ICD-10-CM | POA: Diagnosis not present

## 2016-10-07 DIAGNOSIS — I119 Hypertensive heart disease without heart failure: Secondary | ICD-10-CM | POA: Diagnosis not present

## 2016-10-07 DIAGNOSIS — I208 Other forms of angina pectoris: Secondary | ICD-10-CM | POA: Diagnosis not present

## 2016-10-07 DIAGNOSIS — I10 Essential (primary) hypertension: Secondary | ICD-10-CM | POA: Diagnosis not present

## 2016-10-07 DIAGNOSIS — I25119 Atherosclerotic heart disease of native coronary artery with unspecified angina pectoris: Secondary | ICD-10-CM | POA: Diagnosis not present

## 2016-10-09 DIAGNOSIS — E784 Other hyperlipidemia: Secondary | ICD-10-CM | POA: Diagnosis not present

## 2016-10-09 DIAGNOSIS — I10 Essential (primary) hypertension: Secondary | ICD-10-CM | POA: Diagnosis not present

## 2016-10-09 DIAGNOSIS — R64 Cachexia: Secondary | ICD-10-CM | POA: Diagnosis not present

## 2016-10-09 DIAGNOSIS — M1993 Secondary osteoarthritis, unspecified site: Secondary | ICD-10-CM | POA: Diagnosis not present

## 2016-10-09 DIAGNOSIS — I25119 Atherosclerotic heart disease of native coronary artery with unspecified angina pectoris: Secondary | ICD-10-CM | POA: Diagnosis not present

## 2016-10-09 DIAGNOSIS — I495 Sick sinus syndrome: Secondary | ICD-10-CM | POA: Diagnosis not present

## 2016-10-10 DIAGNOSIS — M1993 Secondary osteoarthritis, unspecified site: Secondary | ICD-10-CM | POA: Diagnosis not present

## 2016-10-10 DIAGNOSIS — I25119 Atherosclerotic heart disease of native coronary artery with unspecified angina pectoris: Secondary | ICD-10-CM | POA: Diagnosis not present

## 2016-10-10 DIAGNOSIS — E784 Other hyperlipidemia: Secondary | ICD-10-CM | POA: Diagnosis not present

## 2016-10-10 DIAGNOSIS — I10 Essential (primary) hypertension: Secondary | ICD-10-CM | POA: Diagnosis not present

## 2016-10-10 DIAGNOSIS — I495 Sick sinus syndrome: Secondary | ICD-10-CM | POA: Diagnosis not present

## 2016-10-10 DIAGNOSIS — R64 Cachexia: Secondary | ICD-10-CM | POA: Diagnosis not present

## 2016-10-13 DIAGNOSIS — M1993 Secondary osteoarthritis, unspecified site: Secondary | ICD-10-CM | POA: Diagnosis not present

## 2016-10-13 DIAGNOSIS — R64 Cachexia: Secondary | ICD-10-CM | POA: Diagnosis not present

## 2016-10-13 DIAGNOSIS — I495 Sick sinus syndrome: Secondary | ICD-10-CM | POA: Diagnosis not present

## 2016-10-13 DIAGNOSIS — I10 Essential (primary) hypertension: Secondary | ICD-10-CM | POA: Diagnosis not present

## 2016-10-13 DIAGNOSIS — I25119 Atherosclerotic heart disease of native coronary artery with unspecified angina pectoris: Secondary | ICD-10-CM | POA: Diagnosis not present

## 2016-10-13 DIAGNOSIS — E784 Other hyperlipidemia: Secondary | ICD-10-CM | POA: Diagnosis not present

## 2016-10-17 DIAGNOSIS — I495 Sick sinus syndrome: Secondary | ICD-10-CM | POA: Diagnosis not present

## 2016-10-17 DIAGNOSIS — I10 Essential (primary) hypertension: Secondary | ICD-10-CM | POA: Diagnosis not present

## 2016-10-17 DIAGNOSIS — I25119 Atherosclerotic heart disease of native coronary artery with unspecified angina pectoris: Secondary | ICD-10-CM | POA: Diagnosis not present

## 2016-10-17 DIAGNOSIS — E784 Other hyperlipidemia: Secondary | ICD-10-CM | POA: Diagnosis not present

## 2016-10-17 DIAGNOSIS — M1993 Secondary osteoarthritis, unspecified site: Secondary | ICD-10-CM | POA: Diagnosis not present

## 2016-10-17 DIAGNOSIS — R64 Cachexia: Secondary | ICD-10-CM | POA: Diagnosis not present

## 2016-10-21 DIAGNOSIS — I10 Essential (primary) hypertension: Secondary | ICD-10-CM | POA: Diagnosis not present

## 2016-10-21 DIAGNOSIS — I495 Sick sinus syndrome: Secondary | ICD-10-CM | POA: Diagnosis not present

## 2016-10-21 DIAGNOSIS — E784 Other hyperlipidemia: Secondary | ICD-10-CM | POA: Diagnosis not present

## 2016-10-21 DIAGNOSIS — M1993 Secondary osteoarthritis, unspecified site: Secondary | ICD-10-CM | POA: Diagnosis not present

## 2016-10-21 DIAGNOSIS — R64 Cachexia: Secondary | ICD-10-CM | POA: Diagnosis not present

## 2016-10-21 DIAGNOSIS — I25119 Atherosclerotic heart disease of native coronary artery with unspecified angina pectoris: Secondary | ICD-10-CM | POA: Diagnosis not present

## 2016-10-24 DIAGNOSIS — M1993 Secondary osteoarthritis, unspecified site: Secondary | ICD-10-CM | POA: Diagnosis not present

## 2016-10-24 DIAGNOSIS — I495 Sick sinus syndrome: Secondary | ICD-10-CM | POA: Diagnosis not present

## 2016-10-24 DIAGNOSIS — E784 Other hyperlipidemia: Secondary | ICD-10-CM | POA: Diagnosis not present

## 2016-10-24 DIAGNOSIS — I25119 Atherosclerotic heart disease of native coronary artery with unspecified angina pectoris: Secondary | ICD-10-CM | POA: Diagnosis not present

## 2016-10-24 DIAGNOSIS — R64 Cachexia: Secondary | ICD-10-CM | POA: Diagnosis not present

## 2016-10-24 DIAGNOSIS — I10 Essential (primary) hypertension: Secondary | ICD-10-CM | POA: Diagnosis not present

## 2016-10-28 DIAGNOSIS — M1993 Secondary osteoarthritis, unspecified site: Secondary | ICD-10-CM | POA: Diagnosis not present

## 2016-10-28 DIAGNOSIS — R64 Cachexia: Secondary | ICD-10-CM | POA: Diagnosis not present

## 2016-10-28 DIAGNOSIS — I495 Sick sinus syndrome: Secondary | ICD-10-CM | POA: Diagnosis not present

## 2016-10-28 DIAGNOSIS — I10 Essential (primary) hypertension: Secondary | ICD-10-CM | POA: Diagnosis not present

## 2016-10-28 DIAGNOSIS — I25119 Atherosclerotic heart disease of native coronary artery with unspecified angina pectoris: Secondary | ICD-10-CM | POA: Diagnosis not present

## 2016-10-28 DIAGNOSIS — E784 Other hyperlipidemia: Secondary | ICD-10-CM | POA: Diagnosis not present

## 2016-10-29 DIAGNOSIS — I10 Essential (primary) hypertension: Secondary | ICD-10-CM | POA: Diagnosis not present

## 2016-10-29 DIAGNOSIS — R64 Cachexia: Secondary | ICD-10-CM | POA: Diagnosis not present

## 2016-10-29 DIAGNOSIS — M1993 Secondary osteoarthritis, unspecified site: Secondary | ICD-10-CM | POA: Diagnosis not present

## 2016-10-29 DIAGNOSIS — E784 Other hyperlipidemia: Secondary | ICD-10-CM | POA: Diagnosis not present

## 2016-10-29 DIAGNOSIS — I25119 Atherosclerotic heart disease of native coronary artery with unspecified angina pectoris: Secondary | ICD-10-CM | POA: Diagnosis not present

## 2016-10-29 DIAGNOSIS — I495 Sick sinus syndrome: Secondary | ICD-10-CM | POA: Diagnosis not present

## 2016-11-04 DIAGNOSIS — H409 Unspecified glaucoma: Secondary | ICD-10-CM | POA: Diagnosis not present

## 2016-11-04 DIAGNOSIS — I25119 Atherosclerotic heart disease of native coronary artery with unspecified angina pectoris: Secondary | ICD-10-CM | POA: Diagnosis not present

## 2016-11-04 DIAGNOSIS — M1993 Secondary osteoarthritis, unspecified site: Secondary | ICD-10-CM | POA: Diagnosis not present

## 2016-11-04 DIAGNOSIS — R64 Cachexia: Secondary | ICD-10-CM | POA: Diagnosis not present

## 2016-11-04 DIAGNOSIS — I10 Essential (primary) hypertension: Secondary | ICD-10-CM | POA: Diagnosis not present

## 2016-11-04 DIAGNOSIS — E784 Other hyperlipidemia: Secondary | ICD-10-CM | POA: Diagnosis not present

## 2016-11-04 DIAGNOSIS — I495 Sick sinus syndrome: Secondary | ICD-10-CM | POA: Diagnosis not present

## 2016-11-05 DIAGNOSIS — I495 Sick sinus syndrome: Secondary | ICD-10-CM | POA: Diagnosis not present

## 2016-11-05 DIAGNOSIS — E784 Other hyperlipidemia: Secondary | ICD-10-CM | POA: Diagnosis not present

## 2016-11-05 DIAGNOSIS — I10 Essential (primary) hypertension: Secondary | ICD-10-CM | POA: Diagnosis not present

## 2016-11-05 DIAGNOSIS — I25119 Atherosclerotic heart disease of native coronary artery with unspecified angina pectoris: Secondary | ICD-10-CM | POA: Diagnosis not present

## 2016-11-05 DIAGNOSIS — R64 Cachexia: Secondary | ICD-10-CM | POA: Diagnosis not present

## 2016-11-05 DIAGNOSIS — M1993 Secondary osteoarthritis, unspecified site: Secondary | ICD-10-CM | POA: Diagnosis not present

## 2016-11-11 DIAGNOSIS — I25119 Atherosclerotic heart disease of native coronary artery with unspecified angina pectoris: Secondary | ICD-10-CM | POA: Diagnosis not present

## 2016-11-11 DIAGNOSIS — I10 Essential (primary) hypertension: Secondary | ICD-10-CM | POA: Diagnosis not present

## 2016-11-11 DIAGNOSIS — R64 Cachexia: Secondary | ICD-10-CM | POA: Diagnosis not present

## 2016-11-11 DIAGNOSIS — M1993 Secondary osteoarthritis, unspecified site: Secondary | ICD-10-CM | POA: Diagnosis not present

## 2016-11-11 DIAGNOSIS — I495 Sick sinus syndrome: Secondary | ICD-10-CM | POA: Diagnosis not present

## 2016-11-11 DIAGNOSIS — E784 Other hyperlipidemia: Secondary | ICD-10-CM | POA: Diagnosis not present

## 2016-11-14 DIAGNOSIS — R64 Cachexia: Secondary | ICD-10-CM | POA: Diagnosis not present

## 2016-11-14 DIAGNOSIS — I495 Sick sinus syndrome: Secondary | ICD-10-CM | POA: Diagnosis not present

## 2016-11-14 DIAGNOSIS — I10 Essential (primary) hypertension: Secondary | ICD-10-CM | POA: Diagnosis not present

## 2016-11-14 DIAGNOSIS — I25119 Atherosclerotic heart disease of native coronary artery with unspecified angina pectoris: Secondary | ICD-10-CM | POA: Diagnosis not present

## 2016-11-14 DIAGNOSIS — E784 Other hyperlipidemia: Secondary | ICD-10-CM | POA: Diagnosis not present

## 2016-11-14 DIAGNOSIS — M1993 Secondary osteoarthritis, unspecified site: Secondary | ICD-10-CM | POA: Diagnosis not present

## 2016-11-19 DIAGNOSIS — M1993 Secondary osteoarthritis, unspecified site: Secondary | ICD-10-CM | POA: Diagnosis not present

## 2016-11-19 DIAGNOSIS — E784 Other hyperlipidemia: Secondary | ICD-10-CM | POA: Diagnosis not present

## 2016-11-19 DIAGNOSIS — I495 Sick sinus syndrome: Secondary | ICD-10-CM | POA: Diagnosis not present

## 2016-11-19 DIAGNOSIS — I10 Essential (primary) hypertension: Secondary | ICD-10-CM | POA: Diagnosis not present

## 2016-11-19 DIAGNOSIS — R64 Cachexia: Secondary | ICD-10-CM | POA: Diagnosis not present

## 2016-11-19 DIAGNOSIS — I25119 Atherosclerotic heart disease of native coronary artery with unspecified angina pectoris: Secondary | ICD-10-CM | POA: Diagnosis not present

## 2016-11-25 DIAGNOSIS — R64 Cachexia: Secondary | ICD-10-CM | POA: Diagnosis not present

## 2016-11-25 DIAGNOSIS — I10 Essential (primary) hypertension: Secondary | ICD-10-CM | POA: Diagnosis not present

## 2016-11-25 DIAGNOSIS — M1993 Secondary osteoarthritis, unspecified site: Secondary | ICD-10-CM | POA: Diagnosis not present

## 2016-11-25 DIAGNOSIS — I495 Sick sinus syndrome: Secondary | ICD-10-CM | POA: Diagnosis not present

## 2016-11-25 DIAGNOSIS — E784 Other hyperlipidemia: Secondary | ICD-10-CM | POA: Diagnosis not present

## 2016-11-25 DIAGNOSIS — I25119 Atherosclerotic heart disease of native coronary artery with unspecified angina pectoris: Secondary | ICD-10-CM | POA: Diagnosis not present

## 2016-12-04 DIAGNOSIS — I25119 Atherosclerotic heart disease of native coronary artery with unspecified angina pectoris: Secondary | ICD-10-CM | POA: Diagnosis not present

## 2016-12-04 DIAGNOSIS — E784 Other hyperlipidemia: Secondary | ICD-10-CM | POA: Diagnosis not present

## 2016-12-04 DIAGNOSIS — R64 Cachexia: Secondary | ICD-10-CM | POA: Diagnosis not present

## 2016-12-04 DIAGNOSIS — I10 Essential (primary) hypertension: Secondary | ICD-10-CM | POA: Diagnosis not present

## 2016-12-04 DIAGNOSIS — I495 Sick sinus syndrome: Secondary | ICD-10-CM | POA: Diagnosis not present

## 2016-12-04 DIAGNOSIS — M1993 Secondary osteoarthritis, unspecified site: Secondary | ICD-10-CM | POA: Diagnosis not present

## 2016-12-05 DIAGNOSIS — H409 Unspecified glaucoma: Secondary | ICD-10-CM | POA: Diagnosis not present

## 2016-12-05 DIAGNOSIS — E784 Other hyperlipidemia: Secondary | ICD-10-CM | POA: Diagnosis not present

## 2016-12-05 DIAGNOSIS — I25119 Atherosclerotic heart disease of native coronary artery with unspecified angina pectoris: Secondary | ICD-10-CM | POA: Diagnosis not present

## 2016-12-05 DIAGNOSIS — R64 Cachexia: Secondary | ICD-10-CM | POA: Diagnosis not present

## 2016-12-05 DIAGNOSIS — I495 Sick sinus syndrome: Secondary | ICD-10-CM | POA: Diagnosis not present

## 2016-12-05 DIAGNOSIS — I10 Essential (primary) hypertension: Secondary | ICD-10-CM | POA: Diagnosis not present

## 2016-12-05 DIAGNOSIS — M1993 Secondary osteoarthritis, unspecified site: Secondary | ICD-10-CM | POA: Diagnosis not present

## 2016-12-11 DIAGNOSIS — R64 Cachexia: Secondary | ICD-10-CM | POA: Diagnosis not present

## 2016-12-11 DIAGNOSIS — I10 Essential (primary) hypertension: Secondary | ICD-10-CM | POA: Diagnosis not present

## 2016-12-11 DIAGNOSIS — I495 Sick sinus syndrome: Secondary | ICD-10-CM | POA: Diagnosis not present

## 2016-12-11 DIAGNOSIS — M1993 Secondary osteoarthritis, unspecified site: Secondary | ICD-10-CM | POA: Diagnosis not present

## 2016-12-11 DIAGNOSIS — I25119 Atherosclerotic heart disease of native coronary artery with unspecified angina pectoris: Secondary | ICD-10-CM | POA: Diagnosis not present

## 2016-12-11 DIAGNOSIS — E784 Other hyperlipidemia: Secondary | ICD-10-CM | POA: Diagnosis not present

## 2016-12-19 DIAGNOSIS — M1993 Secondary osteoarthritis, unspecified site: Secondary | ICD-10-CM | POA: Diagnosis not present

## 2016-12-19 DIAGNOSIS — R64 Cachexia: Secondary | ICD-10-CM | POA: Diagnosis not present

## 2016-12-19 DIAGNOSIS — I10 Essential (primary) hypertension: Secondary | ICD-10-CM | POA: Diagnosis not present

## 2016-12-19 DIAGNOSIS — I495 Sick sinus syndrome: Secondary | ICD-10-CM | POA: Diagnosis not present

## 2016-12-19 DIAGNOSIS — I25119 Atherosclerotic heart disease of native coronary artery with unspecified angina pectoris: Secondary | ICD-10-CM | POA: Diagnosis not present

## 2016-12-19 DIAGNOSIS — E784 Other hyperlipidemia: Secondary | ICD-10-CM | POA: Diagnosis not present

## 2016-12-23 DIAGNOSIS — I25119 Atherosclerotic heart disease of native coronary artery with unspecified angina pectoris: Secondary | ICD-10-CM | POA: Diagnosis not present

## 2016-12-23 DIAGNOSIS — R64 Cachexia: Secondary | ICD-10-CM | POA: Diagnosis not present

## 2016-12-23 DIAGNOSIS — I495 Sick sinus syndrome: Secondary | ICD-10-CM | POA: Diagnosis not present

## 2016-12-23 DIAGNOSIS — M1993 Secondary osteoarthritis, unspecified site: Secondary | ICD-10-CM | POA: Diagnosis not present

## 2016-12-23 DIAGNOSIS — I10 Essential (primary) hypertension: Secondary | ICD-10-CM | POA: Diagnosis not present

## 2016-12-23 DIAGNOSIS — E784 Other hyperlipidemia: Secondary | ICD-10-CM | POA: Diagnosis not present

## 2016-12-24 DIAGNOSIS — I495 Sick sinus syndrome: Secondary | ICD-10-CM | POA: Diagnosis not present

## 2016-12-24 DIAGNOSIS — M1993 Secondary osteoarthritis, unspecified site: Secondary | ICD-10-CM | POA: Diagnosis not present

## 2016-12-24 DIAGNOSIS — E784 Other hyperlipidemia: Secondary | ICD-10-CM | POA: Diagnosis not present

## 2016-12-24 DIAGNOSIS — I10 Essential (primary) hypertension: Secondary | ICD-10-CM | POA: Diagnosis not present

## 2016-12-24 DIAGNOSIS — R64 Cachexia: Secondary | ICD-10-CM | POA: Diagnosis not present

## 2016-12-24 DIAGNOSIS — I25119 Atherosclerotic heart disease of native coronary artery with unspecified angina pectoris: Secondary | ICD-10-CM | POA: Diagnosis not present

## 2017-01-01 DIAGNOSIS — I10 Essential (primary) hypertension: Secondary | ICD-10-CM | POA: Diagnosis not present

## 2017-01-01 DIAGNOSIS — I25119 Atherosclerotic heart disease of native coronary artery with unspecified angina pectoris: Secondary | ICD-10-CM | POA: Diagnosis not present

## 2017-01-01 DIAGNOSIS — E784 Other hyperlipidemia: Secondary | ICD-10-CM | POA: Diagnosis not present

## 2017-01-01 DIAGNOSIS — M1993 Secondary osteoarthritis, unspecified site: Secondary | ICD-10-CM | POA: Diagnosis not present

## 2017-01-01 DIAGNOSIS — R64 Cachexia: Secondary | ICD-10-CM | POA: Diagnosis not present

## 2017-01-01 DIAGNOSIS — I495 Sick sinus syndrome: Secondary | ICD-10-CM | POA: Diagnosis not present

## 2017-01-04 DIAGNOSIS — I10 Essential (primary) hypertension: Secondary | ICD-10-CM | POA: Diagnosis not present

## 2017-01-04 DIAGNOSIS — I25119 Atherosclerotic heart disease of native coronary artery with unspecified angina pectoris: Secondary | ICD-10-CM | POA: Diagnosis not present

## 2017-01-04 DIAGNOSIS — R64 Cachexia: Secondary | ICD-10-CM | POA: Diagnosis not present

## 2017-01-04 DIAGNOSIS — M1993 Secondary osteoarthritis, unspecified site: Secondary | ICD-10-CM | POA: Diagnosis not present

## 2017-01-04 DIAGNOSIS — E784 Other hyperlipidemia: Secondary | ICD-10-CM | POA: Diagnosis not present

## 2017-01-04 DIAGNOSIS — I495 Sick sinus syndrome: Secondary | ICD-10-CM | POA: Diagnosis not present

## 2017-01-04 DIAGNOSIS — H409 Unspecified glaucoma: Secondary | ICD-10-CM | POA: Diagnosis not present

## 2017-01-05 DIAGNOSIS — M1993 Secondary osteoarthritis, unspecified site: Secondary | ICD-10-CM | POA: Diagnosis not present

## 2017-01-05 DIAGNOSIS — R64 Cachexia: Secondary | ICD-10-CM | POA: Diagnosis not present

## 2017-01-05 DIAGNOSIS — E784 Other hyperlipidemia: Secondary | ICD-10-CM | POA: Diagnosis not present

## 2017-01-05 DIAGNOSIS — I495 Sick sinus syndrome: Secondary | ICD-10-CM | POA: Diagnosis not present

## 2017-01-05 DIAGNOSIS — I10 Essential (primary) hypertension: Secondary | ICD-10-CM | POA: Diagnosis not present

## 2017-01-05 DIAGNOSIS — I25119 Atherosclerotic heart disease of native coronary artery with unspecified angina pectoris: Secondary | ICD-10-CM | POA: Diagnosis not present

## 2017-01-08 DIAGNOSIS — M1993 Secondary osteoarthritis, unspecified site: Secondary | ICD-10-CM | POA: Diagnosis not present

## 2017-01-08 DIAGNOSIS — I10 Essential (primary) hypertension: Secondary | ICD-10-CM | POA: Diagnosis not present

## 2017-01-08 DIAGNOSIS — R64 Cachexia: Secondary | ICD-10-CM | POA: Diagnosis not present

## 2017-01-08 DIAGNOSIS — I495 Sick sinus syndrome: Secondary | ICD-10-CM | POA: Diagnosis not present

## 2017-01-08 DIAGNOSIS — I25119 Atherosclerotic heart disease of native coronary artery with unspecified angina pectoris: Secondary | ICD-10-CM | POA: Diagnosis not present

## 2017-01-08 DIAGNOSIS — E784 Other hyperlipidemia: Secondary | ICD-10-CM | POA: Diagnosis not present

## 2017-01-15 DIAGNOSIS — I25119 Atherosclerotic heart disease of native coronary artery with unspecified angina pectoris: Secondary | ICD-10-CM | POA: Diagnosis not present

## 2017-01-15 DIAGNOSIS — M1993 Secondary osteoarthritis, unspecified site: Secondary | ICD-10-CM | POA: Diagnosis not present

## 2017-01-15 DIAGNOSIS — E784 Other hyperlipidemia: Secondary | ICD-10-CM | POA: Diagnosis not present

## 2017-01-15 DIAGNOSIS — R64 Cachexia: Secondary | ICD-10-CM | POA: Diagnosis not present

## 2017-01-15 DIAGNOSIS — I495 Sick sinus syndrome: Secondary | ICD-10-CM | POA: Diagnosis not present

## 2017-01-15 DIAGNOSIS — I10 Essential (primary) hypertension: Secondary | ICD-10-CM | POA: Diagnosis not present

## 2017-01-19 DIAGNOSIS — I10 Essential (primary) hypertension: Secondary | ICD-10-CM | POA: Diagnosis not present

## 2017-01-19 DIAGNOSIS — I25119 Atherosclerotic heart disease of native coronary artery with unspecified angina pectoris: Secondary | ICD-10-CM | POA: Diagnosis not present

## 2017-01-19 DIAGNOSIS — I495 Sick sinus syndrome: Secondary | ICD-10-CM | POA: Diagnosis not present

## 2017-01-19 DIAGNOSIS — E784 Other hyperlipidemia: Secondary | ICD-10-CM | POA: Diagnosis not present

## 2017-01-19 DIAGNOSIS — R64 Cachexia: Secondary | ICD-10-CM | POA: Diagnosis not present

## 2017-01-19 DIAGNOSIS — M1993 Secondary osteoarthritis, unspecified site: Secondary | ICD-10-CM | POA: Diagnosis not present

## 2017-01-25 DIAGNOSIS — E784 Other hyperlipidemia: Secondary | ICD-10-CM | POA: Diagnosis not present

## 2017-01-25 DIAGNOSIS — R64 Cachexia: Secondary | ICD-10-CM | POA: Diagnosis not present

## 2017-01-25 DIAGNOSIS — I25119 Atherosclerotic heart disease of native coronary artery with unspecified angina pectoris: Secondary | ICD-10-CM | POA: Diagnosis not present

## 2017-01-25 DIAGNOSIS — I10 Essential (primary) hypertension: Secondary | ICD-10-CM | POA: Diagnosis not present

## 2017-01-25 DIAGNOSIS — M1993 Secondary osteoarthritis, unspecified site: Secondary | ICD-10-CM | POA: Diagnosis not present

## 2017-01-25 DIAGNOSIS — I495 Sick sinus syndrome: Secondary | ICD-10-CM | POA: Diagnosis not present

## 2017-01-26 DIAGNOSIS — I495 Sick sinus syndrome: Secondary | ICD-10-CM | POA: Diagnosis not present

## 2017-01-26 DIAGNOSIS — I25119 Atherosclerotic heart disease of native coronary artery with unspecified angina pectoris: Secondary | ICD-10-CM | POA: Diagnosis not present

## 2017-01-26 DIAGNOSIS — R64 Cachexia: Secondary | ICD-10-CM | POA: Diagnosis not present

## 2017-01-26 DIAGNOSIS — E784 Other hyperlipidemia: Secondary | ICD-10-CM | POA: Diagnosis not present

## 2017-01-26 DIAGNOSIS — M1993 Secondary osteoarthritis, unspecified site: Secondary | ICD-10-CM | POA: Diagnosis not present

## 2017-01-26 DIAGNOSIS — I10 Essential (primary) hypertension: Secondary | ICD-10-CM | POA: Diagnosis not present

## 2017-01-31 ENCOUNTER — Encounter (HOSPITAL_COMMUNITY): Payer: Self-pay | Admitting: Emergency Medicine

## 2017-01-31 ENCOUNTER — Emergency Department (HOSPITAL_COMMUNITY)

## 2017-01-31 ENCOUNTER — Emergency Department (HOSPITAL_COMMUNITY)
Admission: EM | Admit: 2017-01-31 | Discharge: 2017-02-01 | Disposition: A | Discharge status: Home / Self Care | Attending: Emergency Medicine | Admitting: Emergency Medicine

## 2017-01-31 DIAGNOSIS — R079 Chest pain, unspecified: Secondary | ICD-10-CM | POA: Diagnosis not present

## 2017-01-31 DIAGNOSIS — Z7982 Long term (current) use of aspirin: Secondary | ICD-10-CM | POA: Diagnosis not present

## 2017-01-31 DIAGNOSIS — I872 Venous insufficiency (chronic) (peripheral): Secondary | ICD-10-CM

## 2017-01-31 DIAGNOSIS — M1993 Secondary osteoarthritis, unspecified site: Secondary | ICD-10-CM | POA: Diagnosis not present

## 2017-01-31 DIAGNOSIS — I878 Other specified disorders of veins: Secondary | ICD-10-CM | POA: Insufficient documentation

## 2017-01-31 DIAGNOSIS — Z96642 Presence of left artificial hip joint: Secondary | ICD-10-CM | POA: Insufficient documentation

## 2017-01-31 DIAGNOSIS — L03116 Cellulitis of left lower limb: Secondary | ICD-10-CM

## 2017-01-31 DIAGNOSIS — I2510 Atherosclerotic heart disease of native coronary artery without angina pectoris: Secondary | ICD-10-CM | POA: Insufficient documentation

## 2017-01-31 DIAGNOSIS — I25119 Atherosclerotic heart disease of native coronary artery with unspecified angina pectoris: Secondary | ICD-10-CM | POA: Diagnosis not present

## 2017-01-31 DIAGNOSIS — Z79899 Other long term (current) drug therapy: Secondary | ICD-10-CM | POA: Diagnosis not present

## 2017-01-31 DIAGNOSIS — M79605 Pain in left leg: Secondary | ICD-10-CM

## 2017-01-31 DIAGNOSIS — I119 Hypertensive heart disease without heart failure: Secondary | ICD-10-CM | POA: Insufficient documentation

## 2017-01-31 DIAGNOSIS — E784 Other hyperlipidemia: Secondary | ICD-10-CM | POA: Diagnosis not present

## 2017-01-31 DIAGNOSIS — I10 Essential (primary) hypertension: Secondary | ICD-10-CM | POA: Diagnosis not present

## 2017-01-31 DIAGNOSIS — D61818 Other pancytopenia: Secondary | ICD-10-CM | POA: Diagnosis present

## 2017-01-31 DIAGNOSIS — I447 Left bundle-branch block, unspecified: Secondary | ICD-10-CM | POA: Diagnosis present

## 2017-01-31 DIAGNOSIS — Z7902 Long term (current) use of antithrombotics/antiplatelets: Secondary | ICD-10-CM | POA: Diagnosis not present

## 2017-01-31 DIAGNOSIS — R64 Cachexia: Secondary | ICD-10-CM | POA: Diagnosis not present

## 2017-01-31 DIAGNOSIS — M79604 Pain in right leg: Secondary | ICD-10-CM

## 2017-01-31 DIAGNOSIS — M79662 Pain in left lower leg: Secondary | ICD-10-CM | POA: Diagnosis not present

## 2017-01-31 DIAGNOSIS — I495 Sick sinus syndrome: Secondary | ICD-10-CM | POA: Diagnosis not present

## 2017-01-31 DIAGNOSIS — M79661 Pain in right lower leg: Secondary | ICD-10-CM | POA: Diagnosis not present

## 2017-01-31 LAB — CBC
HEMATOCRIT: 30.9 % — AB (ref 36.0–46.0)
Hemoglobin: 10.2 g/dL — ABNORMAL LOW (ref 12.0–15.0)
MCH: 26.8 pg (ref 26.0–34.0)
MCHC: 33 g/dL (ref 30.0–36.0)
MCV: 81.3 fL (ref 78.0–100.0)
PLATELETS: 93 10*3/uL — AB (ref 150–400)
RBC: 3.8 MIL/uL — AB (ref 3.87–5.11)
RDW: 14.4 % (ref 11.5–15.5)
WBC: 3.2 10*3/uL — ABNORMAL LOW (ref 4.0–10.5)

## 2017-01-31 LAB — I-STAT TROPONIN, ED: Troponin i, poc: 0.05 ng/mL (ref 0.00–0.08)

## 2017-01-31 LAB — BASIC METABOLIC PANEL
Anion gap: 7 (ref 5–15)
BUN: 20 mg/dL (ref 6–20)
CHLORIDE: 112 mmol/L — AB (ref 101–111)
CO2: 20 mmol/L — AB (ref 22–32)
Calcium: 8.9 mg/dL (ref 8.9–10.3)
Creatinine, Ser: 1.56 mg/dL — ABNORMAL HIGH (ref 0.44–1.00)
GFR calc Af Amer: 30 mL/min — ABNORMAL LOW (ref >60)
GFR calc non Af Amer: 26 mL/min — ABNORMAL LOW (ref >60)
Glucose, Bld: 94 mg/dL (ref 65–99)
POTASSIUM: 4.4 mmol/L (ref 3.5–5.1)
Sodium: 139 mmol/L (ref 135–145)

## 2017-01-31 MED ORDER — ACETAMINOPHEN 325 MG PO TABS
650.0000 mg | ORAL_TABLET | Freq: Once | ORAL | Status: AC
  Administered 2017-01-31: 650 mg via ORAL
  Filled 2017-01-31: qty 2

## 2017-01-31 MED ORDER — CEPHALEXIN 250 MG PO CAPS
250.0000 mg | ORAL_CAPSULE | Freq: Once | ORAL | Status: AC
  Administered 2017-01-31: 250 mg via ORAL
  Filled 2017-01-31: qty 1

## 2017-01-31 NOTE — ED Triage Notes (Signed)
Per EMS pt from home lives alone, walks with cane, axo4. Pt past MI, 3-4 days CP non radiating, pressure increased with movement, L bundle branch block. 1 nitro pain eased off, pain 4, 18 G L forearm, given 324 mg aspirin

## 2017-01-31 NOTE — ED Provider Notes (Signed)
[facility]-EMERGENCY DEPT Provider Note   CSN: [id number] Arrival date & time: 01/31/17  1535     History   Chief Complaint Chief Complaint  Patient presents with  . Chest Pain    HPI Ashley Duran is a [age] y.o. female.  HPI  Patient presenting with worsening bilateral lower extremity pain and rash for the past few days. She reports legs began "breaking out" a few days ago, which she has been treating with Vaseline, without relief of symptoms. She states her legs have been oozing like "tree sap." She states she has a history of peripheral vascular disease, and thinks this may be related. Patient states she has also been feeling increasingly fatigued for the past few days. Regarding the chest pain reported in triage, she states this has been an ongoing chest pain for a long while now, that is not worsening. Chest pain is made worse with exertion and improved with rest. No other complaints today.  Past Medical History:  Diagnosis Date  . CAD Native   . GOUT   . Hyperlipidemia   . Hypertensive heart disease   . Osteoarthritis   . Pancytopenia [facility](HCC)     Patient Active Problem List   Diagnosis Date Noted  . Chest pain 01/31/2017  . Non-STEMI (non-ST elevated myocardial infarction) (HCC) 05/19/2013  . LBBB (left bundle branch block) 05/17/2013  . Pancytopenia (HCC)   . Gout 05/17/2007  . Hyperlipidemia   . Hypertensive heart disease   . CAD Native   . Osteoarthritis     Past Surgical History:  Procedure Laterality Date  . CATARACT EXTRACTION    . TOTAL HIP ARTHROPLASTY Left     OB History    No data available       Home Medications    Prior to Admission medications   Medication Sig Start Date End Date Taking? Authorizing Provider  amLODipine (NORVASC) 10 MG tablet Take 10 mg by mouth daily.    [provider]  aspirin 81 MG chewable tablet Chew by mouth daily.    [provider]  atorvastatin (LIPITOR) 40 MG tablet Take 1 tablet (40 mg total) by  mouth daily at 6 PM. 05/23/13   Othella Boyerilley, William S, MD  clopidogrel (PLAVIX) 75 MG tablet Take 1 tablet (75 mg total) by mouth daily with breakfast. 05/23/13   Othella Boyerilley, William S, MD  cycloSPORINE (RESTASIS) 0.05 % ophthalmic emulsion Place 1 drop into both eyes 2 (two) times daily.    [provider]  fish oil-omega-3 fatty acids 1000 MG capsule Take 1 g by mouth daily.    [provider]  HYDROcodone-acetaminophen (NORCO/VICODIN) 5-325 MG per tablet Take 1 tablet by mouth every 4 (four) hours as needed for moderate pain. 05/23/13   Othella Boyerilley, William S, MD  isosorbide mononitrate (IMDUR) 120 MG 24 hr tablet Take 1 tablet (120 mg total) by mouth daily. 05/23/13   Othella Boyerilley, William S, MD  metoprolol tartrate (LOPRESSOR) 25 MG tablet Take 1 tablet (25 mg total) by mouth 2 (two) times daily. 05/23/13   Othella Boyerilley, William S, MD  nitroGLYCERIN (NITROSTAT) 0.4 MG SL tablet Place 0.4 mg under the tongue every 5 (five) minutes as needed for chest pain.    [provider]  ranolazine (RANEXA) 500 MG 12 hr tablet Take 1 tablet (500 mg total) by mouth 2 (two) times daily. 05/23/13   Othella Boyerilley, William S, MD  travoprost, benzalkonium, (TRAVATAN) 0.004 % ophthalmic solution Place 1 drop into both eyes at bedtime.  [provider]    Family History History reviewed. No pertinent family history.  Social History Social History  Substance Use Topics  . Smoking status: Never Smoker  . Smokeless tobacco: Never Used  . Alcohol use No     Allergies   Metoprolol and Simvastatin   Review of Systems Review of Systems  Constitutional: Positive for fatigue. Negative for fever.  HENT: Negative.   Eyes: Negative.   Respiratory: Negative for shortness of breath.   Cardiovascular: Positive for chest pain and leg swelling.  Gastrointestinal: Negative for abdominal pain and nausea.  Genitourinary: Negative.   Musculoskeletal: Negative.   Skin: Positive for rash (b/l lower legs).    Neurological: Negative.      Physical Exam Updated Vital Signs BP (!) 193/79   Pulse 70   Resp 15   LMP  (LMP Unknown)   SpO2 100%   Physical Exam  Constitutional: She appears well-developed and well-nourished.  HENT:  Head: Normocephalic and atraumatic.  Mouth/Throat: Oropharynx is clear and moist.  Eyes: Pupils are equal, round, and reactive to light. Conjunctivae and EOM are normal.  Neck: Normal range of motion.  Cardiovascular: Normal rate, regular rhythm, normal heart sounds and intact distal pulses.   Pulmonary/Chest: Effort normal and breath sounds normal. No respiratory distress. She has no wheezes. She has no rales.  Abdominal: Soft. Bowel sounds are normal. She exhibits no distension. There is no tenderness. There is no rebound and no guarding.  Musculoskeletal: Normal range of motion. She exhibits no deformity.  Neurological: She is alert.  Skin: Skin is warm.  B/l LE with hyperpigmentation, tenderness, edema and warmth. Skin is weeping serous fluid.  Psychiatric: She has a normal mood and affect. Her behavior is normal.  Nursing note and vitals reviewed.    ED Treatments / Results  Labs (all labs ordered are listed, but only abnormal results are displayed) Labs Reviewed  BASIC METABOLIC PANEL - Abnormal; Notable for the following:       Result Value   Chloride 112 (*)    CO2 20 (*)    Creatinine, Ser 1.56 (*)    GFR calc non Af Amer 26 (*)    GFR calc Af Amer 30 (*)    All other components within normal limits  CBC - Abnormal; Notable for the following:    WBC 3.2 (*)    RBC 3.80 (*)    Hemoglobin 10.2 (*)    HCT 30.9 (*)    Platelets 93 (*)    All other components within normal limits  I-STAT TROPONIN, ED    EKG  EKG Interpretation None       Radiology Dg Chest 2 View  Result Date: 01/31/2017 CLINICAL DATA:  Chest pain EXAM: CHEST  2 VIEW COMPARISON:  5/16/5 FINDINGS: Mild cardiac enlargement. No pleural effusion or edema identified.  Aortic atherosclerosis is noted. No airspace opacities. IMPRESSION: 1. No acute cardiopulmonary abnormalities. 2.  Aortic Atherosclerosis (ICD10-I70.0). Electronically Signed   By: Signa Kell M.D.   On: 01/31/2017 16:13    Procedures Procedures (including critical care time)  Medications Ordered in ED Medications  acetaminophen (TYLENOL) tablet 650 mg (650 mg Oral Given 01/31/17 2001)  cephALEXin (KEFLEX) capsule 250 mg (250 mg Oral Given 01/31/17 2000)     Initial Impression / Assessment and Plan / ED Course  I have reviewed the triage vital signs and the nursing notes.  Pertinent labs & imaging results that were available during my care of the patient  were reviewed by me and considered in my medical decision making (see chart for details).    Pt with venous stasis in BLE, with assoc pain and signs of infection. Chest pain noted in triage is not patients complaint today, as she states this is a chronic issue. Pt is asymptomatic HTN in ED. Pt lives at home and is having issue ambulating on her own. Pt is on hospice and gets a visit once per week. Pt will not have family member that can assist her until tomorrow. Hospice not reachable as it is after hours. Consult made to case management. Consult to hospitalist made with hopes of obs bed, however was declined. Wound care done on legs, tylenol given for pain.  Pt electing to wait for case management to see her in the morning. Plan for keflex for skin infection with renal-dose adjustment. Patient discussed with and seen by Dr. Madilyn Hookees, who assumed care at shift change.  Final Clinical Impressions(s) / ED Diagnoses   Final diagnoses:  Venous stasis dermatitis of both lower extremities    New Prescriptions New Prescriptions   No medications on file         Russo, Swazil[place]Jordan N, PA-C 01/31/17 2114    Tilden Fossaees, Elizabeth, MD 02/01/17 [phone]

## 2017-02-01 DIAGNOSIS — I25119 Atherosclerotic heart disease of native coronary artery with unspecified angina pectoris: Secondary | ICD-10-CM | POA: Diagnosis not present

## 2017-02-01 DIAGNOSIS — I10 Essential (primary) hypertension: Secondary | ICD-10-CM | POA: Diagnosis not present

## 2017-02-01 DIAGNOSIS — R64 Cachexia: Secondary | ICD-10-CM | POA: Diagnosis not present

## 2017-02-01 DIAGNOSIS — I495 Sick sinus syndrome: Secondary | ICD-10-CM | POA: Diagnosis not present

## 2017-02-01 DIAGNOSIS — E784 Other hyperlipidemia: Secondary | ICD-10-CM | POA: Diagnosis not present

## 2017-02-01 DIAGNOSIS — M1993 Secondary osteoarthritis, unspecified site: Secondary | ICD-10-CM | POA: Diagnosis not present

## 2017-02-01 MED ORDER — CEPHALEXIN 250 MG PO CAPS
250.0000 mg | ORAL_CAPSULE | Freq: Two times a day (BID) | ORAL | Status: DC
  Administered 2017-02-01: 250 mg via ORAL
  Filled 2017-02-01: qty 1

## 2017-02-01 MED ORDER — NITROGLYCERIN 0.4 MG SL SUBL: 0.4000 mg | SUBLINGUAL_TABLET | SUBLINGUAL | Status: DC | PRN

## 2017-02-01 MED ORDER — HYDROCODONE-ACETAMINOPHEN 5-325 MG PO TABS
1.0000 | ORAL_TABLET | ORAL | Status: DC | PRN
  Administered 2017-02-01: 1 via ORAL
  Filled 2017-02-01: qty 1

## 2017-02-01 MED ORDER — CEPHALEXIN 250 MG PO CAPS: 250.0000 mg | ORAL_CAPSULE | Freq: Four times a day (QID) | ORAL | 0 refills | Status: DC

## 2017-02-01 NOTE — ED Notes (Signed)
DNR form given to granddaughter, POA.

## 2017-02-01 NOTE — ED Notes (Signed)
French Anaracy, Hospice RN, w/pt and family.

## 2017-02-01 NOTE — Discharge Instructions (Signed)
Please keep legs elevated when sitting.  Take antibiotics as prescribed.  Return if worsening redness, swelling, or fever.

## 2017-02-01 NOTE — ED Notes (Signed)
Pt given sandwich and water.

## 2017-02-01 NOTE — Progress Notes (Signed)
1300---[facility]  Visited with patient and granddaughter Eber JonesCarolyn, great nephew and niece in the ED. Patient apparently called EMS yesterday with complaints of CP. Hospice was not notified. She was given 1 nitro and pain eased off.  In ED she presents with worsening bilateral lower extremity pain and rash and increasing fatigue. Regarding the chest pain reported in triage, the patient shares this has been ongoing chest pain for a long time, that is not worsening.   Case management was called due to concerns of pt stating she did not have anyone to help her at home and family is wondering about ALF or SNF placement.   Discussion with patient who is A & O, patient wants to return home, she does not want to go to a SNF or ALF at this time. Granddaughter Eber JonesCarolyn states she is the primary caregiver and she is unable at this time to care for patient due to her own health concerns. Nephew states that family say they will help but are not reliable.  Spoke with Louie BostonLynn Duffy, CSW with [facility] after reviewing notes. Pt is able to make decisions for herself and [facility] RN can do needed dressing changes. Family frustrated as they feel she is unsafe at home but patient refuses placement at this time. Active listening and support offered. Made suggestions of using family to assist with care of patient, looking into private pay sitters/cna's. Family agrees to discuss these issues with [facility] RN and care team.  Thank you, Haynes Bastracy Ennis, RN [facility] Hospital Liaison  [phone]  [facility] Hospital Liaisons are on AMION.

## 2017-02-01 NOTE — ED Notes (Signed)
Case Manager advised will speak w/Ashley, SW.

## 2017-02-01 NOTE — ED Notes (Signed)
Morrie SheldonAshley, SW, called and advised she spoke w/Hospice who advised will have French Anaracy, Charity fundraiserN, come to ED to assess pt. Pt and nephew aware. Pt eating rest of her breakfast and talking and laughing w/nephew.

## 2017-02-01 NOTE — ED Notes (Signed)
Ace wrap over gauze noted to bil lower legs - loosely wrapped to feet. Pt able to wiggle toes and move ankles w/o difficulty. States wrapping feels uncomfortable for her. RN ensured dressings are loosely wrapped. Feet elevated for comfort. Pt has cane and shoes at bedside. Pt wearing personal gown. Pt alert, oriented, calm, cooperative. States "I must have called 911 because of my legs - is why I'm here." States she lives alone and is under the care of Hospice.

## 2017-02-01 NOTE — ED Notes (Signed)
Pt's family member has arrived to room and is talking w/pt.

## 2017-02-01 NOTE — ED Notes (Signed)
Dr Rosalia Hammersay aware French Anaracy, Hospice RN, advised pt does not want placement at this time. Pt alert, oriented. Granddaughter wants pt placed but understands we are not able to force pt to go into a nursing home/assisted living.

## 2017-02-01 NOTE — ED Notes (Signed)
Dr Rosalia Hammersay in w/pt and family.

## 2017-02-01 NOTE — ED Notes (Signed)
Pt's left leg unwrapped and rewrapped w/non-adherent dressing, gauze, and ACE wrap d/t pt c/o pain - small areas of red, open skin noted. Pt's legs elevated for comfort. States feels better.

## 2017-02-01 NOTE — ED Provider Notes (Signed)
This is a [age] year old lady who was seen and evaluated yesterday. She presented because she has some increased swelling and discoloration of her chronic venous stasis. She started on Keflex for some secondary infection. On hospice care. She was kept overnight because there is no one at home to assist her. She is evaluated today and nursing home placement was offered but patient refuses. Hospice coming in once a week. She'll be discharged on Keflex.   Ashley Duran, Taggart Prasad, MD 02/01/17 [phone]

## 2017-02-01 NOTE — Progress Notes (Signed)
Clinical Social Worker received consult from patients RN Kriste BasqueBecky stating that MD might want SNF or ALF for patient since patient lives at home by herself. Patient is being followed by [facility] at [facility]. CSW spoke to Missy [phone]) and made her aware that the patient is currently in the hospital. Missy stated they were not aware [facility]. [facility] stated they will send their nurse French Anaracy to see patient and assist her with any needs she may have. If medical team has any question please fell free to contact [facility].   Marrianne MoodAshley Jessaca Philippi, MSW,  [employer] [phone]

## 2017-02-01 NOTE — ED Notes (Signed)
Left message for Case Manager re: consult. Spoke w/Ashley, SW, who advised will contact pt's Hospice contact person re: consult.

## 2017-02-01 NOTE — ED Notes (Signed)
Pt eating breakfast. Offered toileting - declined at this time.

## 2017-02-02 DIAGNOSIS — M1993 Secondary osteoarthritis, unspecified site: Secondary | ICD-10-CM | POA: Diagnosis not present

## 2017-02-02 DIAGNOSIS — E784 Other hyperlipidemia: Secondary | ICD-10-CM | POA: Diagnosis not present

## 2017-02-02 DIAGNOSIS — I10 Essential (primary) hypertension: Secondary | ICD-10-CM | POA: Diagnosis not present

## 2017-02-02 DIAGNOSIS — I495 Sick sinus syndrome: Secondary | ICD-10-CM | POA: Diagnosis not present

## 2017-02-02 DIAGNOSIS — R64 Cachexia: Secondary | ICD-10-CM | POA: Diagnosis not present

## 2017-02-02 DIAGNOSIS — I25119 Atherosclerotic heart disease of native coronary artery with unspecified angina pectoris: Secondary | ICD-10-CM | POA: Diagnosis not present

## 2017-02-02 LAB — CBG MONITORING, ED: GLUCOSE-CAPILLARY: 89 mg/dL (ref 65–99)

## 2017-02-03 DIAGNOSIS — E784 Other hyperlipidemia: Secondary | ICD-10-CM | POA: Diagnosis not present

## 2017-02-03 DIAGNOSIS — M1993 Secondary osteoarthritis, unspecified site: Secondary | ICD-10-CM | POA: Diagnosis not present

## 2017-02-03 DIAGNOSIS — I25119 Atherosclerotic heart disease of native coronary artery with unspecified angina pectoris: Secondary | ICD-10-CM | POA: Diagnosis not present

## 2017-02-03 DIAGNOSIS — I10 Essential (primary) hypertension: Secondary | ICD-10-CM | POA: Diagnosis not present

## 2017-02-03 DIAGNOSIS — R64 Cachexia: Secondary | ICD-10-CM | POA: Diagnosis not present

## 2017-02-03 DIAGNOSIS — I495 Sick sinus syndrome: Secondary | ICD-10-CM | POA: Diagnosis not present

## 2017-02-04 DIAGNOSIS — I25119 Atherosclerotic heart disease of native coronary artery with unspecified angina pectoris: Secondary | ICD-10-CM | POA: Diagnosis not present

## 2017-02-04 DIAGNOSIS — M1993 Secondary osteoarthritis, unspecified site: Secondary | ICD-10-CM | POA: Diagnosis not present

## 2017-02-04 DIAGNOSIS — I495 Sick sinus syndrome: Secondary | ICD-10-CM | POA: Diagnosis not present

## 2017-02-04 DIAGNOSIS — R64 Cachexia: Secondary | ICD-10-CM | POA: Diagnosis not present

## 2017-02-04 DIAGNOSIS — I10 Essential (primary) hypertension: Secondary | ICD-10-CM | POA: Diagnosis not present

## 2017-02-04 DIAGNOSIS — H409 Unspecified glaucoma: Secondary | ICD-10-CM | POA: Diagnosis not present

## 2017-02-04 DIAGNOSIS — E784 Other hyperlipidemia: Secondary | ICD-10-CM | POA: Diagnosis not present

## 2017-02-06 DIAGNOSIS — E784 Other hyperlipidemia: Secondary | ICD-10-CM | POA: Diagnosis not present

## 2017-02-06 DIAGNOSIS — I25119 Atherosclerotic heart disease of native coronary artery with unspecified angina pectoris: Secondary | ICD-10-CM | POA: Diagnosis not present

## 2017-02-06 DIAGNOSIS — R64 Cachexia: Secondary | ICD-10-CM | POA: Diagnosis not present

## 2017-02-06 DIAGNOSIS — M1993 Secondary osteoarthritis, unspecified site: Secondary | ICD-10-CM | POA: Diagnosis not present

## 2017-02-06 DIAGNOSIS — I10 Essential (primary) hypertension: Secondary | ICD-10-CM | POA: Diagnosis not present

## 2017-02-06 DIAGNOSIS — I495 Sick sinus syndrome: Secondary | ICD-10-CM | POA: Diagnosis not present

## 2017-02-11 DIAGNOSIS — E784 Other hyperlipidemia: Secondary | ICD-10-CM | POA: Diagnosis not present

## 2017-02-11 DIAGNOSIS — I495 Sick sinus syndrome: Secondary | ICD-10-CM | POA: Diagnosis not present

## 2017-02-11 DIAGNOSIS — M1993 Secondary osteoarthritis, unspecified site: Secondary | ICD-10-CM | POA: Diagnosis not present

## 2017-02-11 DIAGNOSIS — I10 Essential (primary) hypertension: Secondary | ICD-10-CM | POA: Diagnosis not present

## 2017-02-11 DIAGNOSIS — R64 Cachexia: Secondary | ICD-10-CM | POA: Diagnosis not present

## 2017-02-11 DIAGNOSIS — I25119 Atherosclerotic heart disease of native coronary artery with unspecified angina pectoris: Secondary | ICD-10-CM | POA: Diagnosis not present

## 2017-02-14 ENCOUNTER — Emergency Department (HOSPITAL_COMMUNITY)
Admission: EM | Admit: 2017-02-14 | Discharge: 2017-02-14 | Disposition: A | Discharge status: Home / Self Care | Attending: Emergency Medicine | Admitting: Emergency Medicine

## 2017-02-14 ENCOUNTER — Encounter (HOSPITAL_COMMUNITY): Payer: Self-pay

## 2017-02-14 DIAGNOSIS — M1993 Secondary osteoarthritis, unspecified site: Secondary | ICD-10-CM | POA: Diagnosis not present

## 2017-02-14 DIAGNOSIS — Z96642 Presence of left artificial hip joint: Secondary | ICD-10-CM | POA: Insufficient documentation

## 2017-02-14 DIAGNOSIS — I10 Essential (primary) hypertension: Secondary | ICD-10-CM | POA: Insufficient documentation

## 2017-02-14 DIAGNOSIS — E784 Other hyperlipidemia: Secondary | ICD-10-CM | POA: Diagnosis not present

## 2017-02-14 DIAGNOSIS — M79673 Pain in unspecified foot: Secondary | ICD-10-CM | POA: Diagnosis not present

## 2017-02-14 DIAGNOSIS — R627 Adult failure to thrive: Secondary | ICD-10-CM | POA: Insufficient documentation

## 2017-02-14 DIAGNOSIS — R64 Cachexia: Secondary | ICD-10-CM | POA: Diagnosis not present

## 2017-02-14 DIAGNOSIS — R030 Elevated blood-pressure reading, without diagnosis of hypertension: Secondary | ICD-10-CM | POA: Diagnosis not present

## 2017-02-14 DIAGNOSIS — L03116 Cellulitis of left lower limb: Secondary | ICD-10-CM | POA: Insufficient documentation

## 2017-02-14 DIAGNOSIS — I495 Sick sinus syndrome: Secondary | ICD-10-CM | POA: Diagnosis not present

## 2017-02-14 DIAGNOSIS — L03115 Cellulitis of right lower limb: Secondary | ICD-10-CM | POA: Insufficient documentation

## 2017-02-14 DIAGNOSIS — R531 Weakness: Secondary | ICD-10-CM | POA: Diagnosis present

## 2017-02-14 DIAGNOSIS — I25119 Atherosclerotic heart disease of native coronary artery with unspecified angina pectoris: Secondary | ICD-10-CM | POA: Diagnosis not present

## 2017-02-14 LAB — CBC WITH DIFFERENTIAL/PLATELET
Basophils Absolute: 0 10*3/uL (ref 0.0–0.1)
Basophils Relative: 0 %
EOS PCT: 3 %
Eosinophils Absolute: 0.1 10*3/uL (ref 0.0–0.7)
HCT: 32 % — ABNORMAL LOW (ref 36.0–46.0)
Hemoglobin: 10.7 g/dL — ABNORMAL LOW (ref 12.0–15.0)
LYMPHS ABS: 1 10*3/uL (ref 0.7–4.0)
Lymphocytes Relative: 43 %
MCH: 27.2 pg (ref 26.0–34.0)
MCHC: 33.4 g/dL (ref 30.0–36.0)
MCV: 81.4 fL (ref 78.0–100.0)
MONO ABS: 0.2 10*3/uL (ref 0.1–1.0)
MONOS PCT: 8 %
Neutro Abs: 1.1 10*3/uL — ABNORMAL LOW (ref 1.7–7.7)
Neutrophils Relative %: 46 %
PLATELETS: 92 10*3/uL — AB (ref 150–400)
RBC: 3.93 MIL/uL (ref 3.87–5.11)
RDW: 15.1 % (ref 11.5–15.5)
WBC: 2.4 10*3/uL — AB (ref 4.0–10.5)

## 2017-02-14 LAB — BASIC METABOLIC PANEL
Anion gap: 7 (ref 5–15)
BUN: 19 mg/dL (ref 6–20)
CHLORIDE: 110 mmol/L (ref 101–111)
CO2: 23 mmol/L (ref 22–32)
CREATININE: 1.36 mg/dL — AB (ref 0.44–1.00)
Calcium: 9.1 mg/dL (ref 8.9–10.3)
GFR calc Af Amer: 35 mL/min — ABNORMAL LOW (ref >60)
GFR calc non Af Amer: 30 mL/min — ABNORMAL LOW (ref >60)
GLUCOSE: 91 mg/dL (ref 65–99)
Potassium: 4.2 mmol/L (ref 3.5–5.1)
SODIUM: 140 mmol/L (ref 135–145)

## 2017-02-14 MED ORDER — HYDROCODONE-ACETAMINOPHEN 5-325 MG PO TABS
1.0000 | ORAL_TABLET | Freq: Once | ORAL | Status: AC
  Administered 2017-02-14: 1 via ORAL
  Filled 2017-02-14: qty 1

## 2017-02-14 MED ORDER — HYDROCODONE-ACETAMINOPHEN 5-325 MG PO TABS: 1.0000 | ORAL_TABLET | ORAL | 0 refills | Status: AC | PRN

## 2017-02-14 MED ORDER — ACETAMINOPHEN 325 MG PO TABS
650.0000 mg | ORAL_TABLET | Freq: Once | ORAL | Status: AC
Start: 2017-02-14 — End: 2017-02-14
  Administered 2017-02-14: 650 mg via ORAL
  Filled 2017-02-14: qty 2

## 2017-02-14 NOTE — ED Provider Notes (Signed)
[facility]-EMERGENCY DEPT Provider Note   CSN: [id number] Arrival date & time: 02/14/17  1435     History   Chief Complaint No chief complaint on file.   HPI Ashley Duran is a 83 y.o. female.  Pt presents to the ED today with weakness.  She is 89 and has been living alone.  She has hospice, but they come out only once per week.  The pt was here on 7/28 for leg pain and inability to take care of herself.  At that time, pt did not want to go to a SNF.  The pt has changed her mind now.  She has not been eating or drinking much.  She has not been able to get out of bed.  She said she is ready to die.      Past Medical History:  Diagnosis Date  . CAD Native   . GOUT   . Hyperlipidemia   . Hypertensive heart disease   . Osteoarthritis   . Pancytopenia [facility])     Patient Active Problem List   Diagnosis Date Noted  . Chest pain 01/31/2017  . Non-STEMI (non-ST elevated myocardial infarction) (HCC) 05/19/2013  . LBBB (left bundle branch block) 05/17/2013  . Pancytopenia (HCC)   . Gout 05/17/2007  . Hyperlipidemia   . Hypertensive heart disease   . CAD Native   . Osteoarthritis     Past Surgical History:  Procedure Laterality Date  . CATARACT EXTRACTION    . TOTAL HIP ARTHROPLASTY Left     OB History    No data available       Home Medications    Prior to Admission medications   Medication Sig Start Date End Date Taking? Authorizing Provider  nitroGLYCERIN (NITROSTAT) 0.4 MG SL tablet Place 0.4 mg under the tongue every 5 (five) minutes as needed for chest pain.   Yes [provider]  atorvastatin (LIPITOR) 40 MG tablet Take 1 tablet (40 mg total) by mouth daily at 6 PM. Patient not taking: Reported on 01/31/2017 05/23/13   Othella Boyer, MD  cephALEXin (KEFLEX) 250 MG capsule Take 1 capsule (250 mg total) by mouth 4 (four) times daily. Patient not taking: Reported on 02/14/2017 02/01/17   Margarita Grizzle, MD  clopidogrel (PLAVIX) 75 MG tablet Take 1  tablet (75 mg total) by mouth daily with breakfast. Patient not taking: Reported on 01/31/2017 05/23/13   Othella Boyer, MD  HYDROcodone-acetaminophen (NORCO/VICODIN) 5-325 MG tablet Take 1 tablet by mouth every 4 (four) hours as needed. 02/14/17   Jacalyn Lefevre, MD  isosorbide mononitrate (IMDUR) 120 MG 24 hr tablet Take 1 tablet (120 mg total) by mouth daily. Patient not taking: Reported on 01/31/2017 05/23/13   Othella Boyer, MD  metoprolol tartrate (LOPRESSOR) 25 MG tablet Take 1 tablet (25 mg total) by mouth 2 (two) times daily. Patient not taking: Reported on 01/31/2017 05/23/13   Othella Boyer, MD  ranolazine (RANEXA) 500 MG 12 hr tablet Take 1 tablet (500 mg total) by mouth 2 (two) times daily. Patient not taking: Reported on 01/31/2017 05/23/13   Othella Boyer, MD    Family History History reviewed. No pertinent family history.  Social History Social History  Substance Use Topics  . Smoking status: Never Smoker  . Smokeless tobacco: Never Used  . Alcohol use No     Allergies   Metoprolol and Simvastatin   Review of Systems Review of Systems  Constitutional: Positive for fatigue.  Musculoskeletal:  Bilateral leg pain  All other systems reviewed and are negative.    Physical Exam Updated Vital Signs BP (!) 214/91 (BP Location: Right Arm)   Pulse (!) 56   Temp 97.9 F (36.6 C) (Oral)   Resp 16   LMP  (LMP Unknown)   SpO2 100%   Physical Exam  Constitutional: She is oriented to person, place, and time. She appears well-developed and well-nourished.  HENT:  Head: Normocephalic and atraumatic.  Right Ear: External ear normal.  Left Ear: External ear normal.  Nose: Nose normal.  Mouth/Throat: Oropharynx is clear and moist.  Eyes: Pupils are equal, round, and reactive to light. Conjunctivae and EOM are normal.  Neck: Normal range of motion. Neck supple.  Cardiovascular: Normal rate, regular rhythm, normal heart sounds and intact distal  pulses.   Pulmonary/Chest: Effort normal and breath sounds normal.  Abdominal: Soft. Bowel sounds are normal.  Musculoskeletal:  Bilateral le redness and swelling  Neurological: She is alert and oriented to person, place, and time.  Skin: Skin is warm.  Psychiatric: She has a normal mood and affect. Her behavior is normal. Judgment and thought content normal.  Nursing note and vitals reviewed.    ED Treatments / Results  Labs (all labs ordered are listed, but only abnormal results are displayed) Labs Reviewed  BASIC METABOLIC PANEL - Abnormal; Notable for the following:       Result Value   Creatinine, Ser 1.36 (*)    GFR calc non Af Amer 30 (*)    GFR calc Af Amer 35 (*)    All other components within normal limits  CBC WITH DIFFERENTIAL/PLATELET - Abnormal; Notable for the following:    WBC 2.4 (*)    Hemoglobin 10.7 (*)    HCT 32.0 (*)    Platelets 92 (*)    Neutro Abs 1.1 (*)    All other components within normal limits  URINALYSIS, ROUTINE W REFLEX MICROSCOPIC    EKG  EKG Interpretation None       Radiology No results found.  Procedures Procedures (including critical care time)  Medications Ordered in ED Medications  HYDROcodone-acetaminophen (NORCO/VICODIN) 5-325 MG per tablet 1 tablet (not administered)  acetaminophen (TYLENOL) tablet 650 mg (650 mg Oral Given 02/14/17 1610)     Initial Impression / Assessment and Plan / ED Course  I have reviewed the triage vital signs and the nursing notes.  Pertinent labs & imaging results that were available during my care of the patient were reviewed by me and considered in my medical decision making (see chart for details).     Pt d/w case management and with hospice.  The pt is now ready for SNF placement.  Unfortunately, [facility] does not have any beds.  The pt does not meet criteria for inpatient admission.  Her granddaughter was called and will pick her up.  Hospice will continue to follow.  Pt does have  keflex for legs.  She is instructed to finish taking her abx.  Final Clinical Impressions(s) / ED Diagnoses   Final diagnoses:  Failure to thrive in adult  Cellulitis of right lower extremity  Cellulitis of left lower extremity    New Prescriptions New Prescriptions   HYDROCODONE-ACETAMINOPHEN (NORCO/VICODIN) 5-325 MG TABLET    Take 1 tablet by mouth every 4 (four) hours as needed.     Jacalyn LefevreHaviland, Keysi Oelkers, MD 02/14/17 1950

## 2017-02-14 NOTE — Progress Notes (Signed)
[facility] SW Note  Received notification from [facility] that [facility] patient was in the emergency room, possibly interested in [facility] if eligible. No notes in chart at that time. Spoke with patient's [facility] SW to learn history. Melanie spoke with patient's primary contact, her g-dtr Westley Hummer, who was notified by a neighbor that patient was taken to the ED. Per Threasa Beards, g-dtr has no knowledge of what caused patient to come to ED. Threasa Beards has offered to assist with placement of this patient in the past and will follow up if patient returns home.   Met briefly with patient who reports she's tired, in pain and her "feet don't work anymore." She tells me she needs soemthing for the pain in her feet. Spoke with ED RN who reports labs pending at that time.   [facility] does not have a room available at this time.  Please notify [facility] at [phone] if patient returns home this evening. Otherwise, will follow up in am.  Thank you,  Erling Conte, LCSW [phone]

## 2017-02-14 NOTE — Care Management Note (Addendum)
Case Management Note  Patient Details  Name: Shona NeedlesMinnie L Tien MRN: [id number] Date of Birth: 05-01-1913  Subjective/Objective:        FTT            Action/Plan: Discharge Planning: NCM contacted [facility], Carley Hammedva to make aware of pt's in [facility]. Pt is a Hospice Patient and message to notify Hospice. Spoke to attending and pt is ready for [facility]. Made [facility] rep aware. She will come and speak to pt in ED. Will check to see if bed available.    Expected Discharge Date:               Expected Discharge Plan:  Home w Hospice Care  In-House Referral:  Clinical Social Work  Discharge planning Services  CM Consult  Post Acute Care Choice:  Hospice, Resumption of Svcs/PTA Provider Choice offered to:  Patient  DME Arranged:  N/A DME Agency:  NA  HH Arranged:  RN HH Agency:  [facility]  Status of Service:  Completed, signed off  If discussed at [employer] Meetings, dates discussed:    Additional Comments:  Elliot CousinShavis, Khaniyah Bezek Ellen, RN 02/14/2017, 3:42 PM

## 2017-02-14 NOTE — ED Notes (Signed)
Pt denied being able to urinate at this time.

## 2017-02-14 NOTE — ED Triage Notes (Signed)
Patient coming from home lives alone failure to thrive, pt unable to lives by her self know and need assistance ADL'S. Patient A/O X 4 and oriented. Pt denies pain at this time,

## 2017-02-15 DIAGNOSIS — M1993 Secondary osteoarthritis, unspecified site: Secondary | ICD-10-CM | POA: Diagnosis not present

## 2017-02-15 DIAGNOSIS — E784 Other hyperlipidemia: Secondary | ICD-10-CM | POA: Diagnosis not present

## 2017-02-15 DIAGNOSIS — I495 Sick sinus syndrome: Secondary | ICD-10-CM | POA: Diagnosis not present

## 2017-02-15 DIAGNOSIS — R64 Cachexia: Secondary | ICD-10-CM | POA: Diagnosis not present

## 2017-02-15 DIAGNOSIS — I25119 Atherosclerotic heart disease of native coronary artery with unspecified angina pectoris: Secondary | ICD-10-CM | POA: Diagnosis not present

## 2017-02-15 DIAGNOSIS — I10 Essential (primary) hypertension: Secondary | ICD-10-CM | POA: Diagnosis not present

## 2017-02-16 DIAGNOSIS — M1993 Secondary osteoarthritis, unspecified site: Secondary | ICD-10-CM | POA: Diagnosis not present

## 2017-02-16 DIAGNOSIS — I495 Sick sinus syndrome: Secondary | ICD-10-CM | POA: Diagnosis not present

## 2017-02-16 DIAGNOSIS — E784 Other hyperlipidemia: Secondary | ICD-10-CM | POA: Diagnosis not present

## 2017-02-16 DIAGNOSIS — I25119 Atherosclerotic heart disease of native coronary artery with unspecified angina pectoris: Secondary | ICD-10-CM | POA: Diagnosis not present

## 2017-02-16 DIAGNOSIS — I10 Essential (primary) hypertension: Secondary | ICD-10-CM | POA: Diagnosis not present

## 2017-02-16 DIAGNOSIS — R64 Cachexia: Secondary | ICD-10-CM | POA: Diagnosis not present

## 2017-02-17 DIAGNOSIS — E784 Other hyperlipidemia: Secondary | ICD-10-CM | POA: Diagnosis not present

## 2017-02-17 DIAGNOSIS — I10 Essential (primary) hypertension: Secondary | ICD-10-CM | POA: Diagnosis not present

## 2017-02-17 DIAGNOSIS — R64 Cachexia: Secondary | ICD-10-CM | POA: Diagnosis not present

## 2017-02-17 DIAGNOSIS — I25119 Atherosclerotic heart disease of native coronary artery with unspecified angina pectoris: Secondary | ICD-10-CM | POA: Diagnosis not present

## 2017-02-17 DIAGNOSIS — I495 Sick sinus syndrome: Secondary | ICD-10-CM | POA: Diagnosis not present

## 2017-02-17 DIAGNOSIS — M1993 Secondary osteoarthritis, unspecified site: Secondary | ICD-10-CM | POA: Diagnosis not present

## 2017-02-18 DIAGNOSIS — R64 Cachexia: Secondary | ICD-10-CM | POA: Diagnosis not present

## 2017-02-18 DIAGNOSIS — M1993 Secondary osteoarthritis, unspecified site: Secondary | ICD-10-CM | POA: Diagnosis not present

## 2017-02-18 DIAGNOSIS — I25119 Atherosclerotic heart disease of native coronary artery with unspecified angina pectoris: Secondary | ICD-10-CM | POA: Diagnosis not present

## 2017-02-18 DIAGNOSIS — I495 Sick sinus syndrome: Secondary | ICD-10-CM | POA: Diagnosis not present

## 2017-02-18 DIAGNOSIS — E784 Other hyperlipidemia: Secondary | ICD-10-CM | POA: Diagnosis not present

## 2017-02-18 DIAGNOSIS — I10 Essential (primary) hypertension: Secondary | ICD-10-CM | POA: Diagnosis not present

## 2017-02-19 ENCOUNTER — Non-Acute Institutional Stay (SKILLED_NURSING_FACILITY): Payer: Medicare Other | Admitting: Adult Health

## 2017-02-19 ENCOUNTER — Encounter: Payer: Self-pay | Admitting: Adult Health

## 2017-02-19 DIAGNOSIS — M1A9XX0 Chronic gout, unspecified, without tophus (tophi): Secondary | ICD-10-CM

## 2017-02-19 DIAGNOSIS — D61818 Other pancytopenia: Secondary | ICD-10-CM | POA: Diagnosis not present

## 2017-02-19 DIAGNOSIS — I252 Old myocardial infarction: Secondary | ICD-10-CM | POA: Diagnosis not present

## 2017-02-19 DIAGNOSIS — E784 Other hyperlipidemia: Secondary | ICD-10-CM | POA: Diagnosis not present

## 2017-02-19 DIAGNOSIS — I119 Hypertensive heart disease without heart failure: Secondary | ICD-10-CM

## 2017-02-19 DIAGNOSIS — I25118 Atherosclerotic heart disease of native coronary artery with other forms of angina pectoris: Secondary | ICD-10-CM

## 2017-02-19 DIAGNOSIS — I10 Essential (primary) hypertension: Secondary | ICD-10-CM | POA: Diagnosis not present

## 2017-02-19 DIAGNOSIS — M150 Primary generalized (osteo)arthritis: Secondary | ICD-10-CM | POA: Diagnosis not present

## 2017-02-19 DIAGNOSIS — R64 Cachexia: Secondary | ICD-10-CM | POA: Diagnosis not present

## 2017-02-19 DIAGNOSIS — M1993 Secondary osteoarthritis, unspecified site: Secondary | ICD-10-CM | POA: Diagnosis not present

## 2017-02-19 DIAGNOSIS — I495 Sick sinus syndrome: Secondary | ICD-10-CM | POA: Diagnosis not present

## 2017-02-19 DIAGNOSIS — I25119 Atherosclerotic heart disease of native coronary artery with unspecified angina pectoris: Secondary | ICD-10-CM | POA: Diagnosis not present

## 2017-02-19 DIAGNOSIS — M159 Polyosteoarthritis, unspecified: Secondary | ICD-10-CM

## 2017-02-19 LAB — HEPATIC FUNCTION PANEL
ALT: 5 — AB (ref 7–35)
AST: 17 (ref 13–35)
Alkaline Phosphatase: 63 (ref 25–125)
Bilirubin, Total: 0.2

## 2017-02-19 LAB — CBC AND DIFFERENTIAL
HEMATOCRIT: 32 — AB (ref 36–46)
HEMOGLOBIN: 10 — AB (ref 12.0–16.0)
NEUTROS ABS: 1
Platelets: 89 — AB (ref 150–399)
WBC: 2.3

## 2017-02-19 LAB — BASIC METABOLIC PANEL
BUN: 22 — AB (ref 4–21)
Creatinine: 1.3 — AB (ref 0.5–1.1)
Glucose: 93
POTASSIUM: 5.7 — AB (ref 3.4–5.3)
Sodium: 141 (ref 137–147)

## 2017-02-19 NOTE — Progress Notes (Signed)
Location:   [facility] Room Number: 111 A Place of Service:  SNF (31)   CODE STATUS:  DNR  Allergies  Allergen Reactions  . Metoprolol     Bradycardia   . Simvastatin     Muscle aches     Chief Complaint  Patient presents with  . Acute Visit    Transfer in from  home on hospice    HPI:  She is a 83 year old who was taken to the ED as she is unable to manage her adl's at home.  Her appetite is poor and has increased weakness. She has been followed by hospice at home and will pick up those services here. She tells me that her feet hurt. She did say that she is ready to die. She tells me that this is a long term placement. She tells me that she is used to being alone. There are no nursing concerns at this time.   Past Medical History:  Diagnosis Date  . CAD Native   . GOUT   . Hyperlipidemia   . Hypertensive heart disease   . Osteoarthritis   . Pancytopenia [facility](HCC)     Past Surgical History:  Procedure Laterality Date  . CATARACT EXTRACTION    . TOTAL HIP ARTHROPLASTY Left     Social History   Social History  . Marital status: Single    Spouse name: N/A  . Number of children: N/A  . Years of education: N/A   Occupational History  . Not on file.   Social History Main Topics  . Smoking status: Never Smoker  . Smokeless tobacco: Never Used  . Alcohol use No  . Drug use: No  . Sexual activity: No   Other Topics Concern  . Not on file   Social History Narrative  . No narrative on file   History reviewed. No pertinent family history.    VITAL SIGNS BP (!) 144/78   Pulse 80   Temp (!) 97.2 F (36.2 C)   Resp 20   Ht 5\' 3"  (1.6 m)   Wt 97 lb (44 kg)   LMP  (LMP Unknown)   SpO2 97%   BMI 17.18 kg/m   Patient's Medications  New Prescriptions   No medications on file  Previous Medications   HYDROCODONE-ACETAMINOPHEN (NORCO/VICODIN) 5-325 MG TABLET    Take 1 tablet by mouth every 4 (four) hours as needed.   NITROGLYCERIN (NITROSTAT)  0.4 MG SL TABLET    Place 0.4 mg under the tongue every 5 (five) minutes as needed for chest pain.  Modified Medications   No medications on file  Discontinued Medications   ATORVASTATIN (LIPITOR) 40 MG TABLET    Take 1 tablet (40 mg total) by mouth daily at 6 PM.   CEPHALEXIN (KEFLEX) 250 MG CAPSULE    Take 1 capsule (250 mg total) by mouth 4 (four) times daily.   CLOPIDOGREL (PLAVIX) 75 MG TABLET    Take 1 tablet (75 mg total) by mouth daily with breakfast.   ISOSORBIDE MONONITRATE (IMDUR) 120 MG 24 HR TABLET    Take 1 tablet (120 mg total) by mouth daily.   METOPROLOL TARTRATE (LOPRESSOR) 25 MG TABLET    Take 1 tablet (25 mg total) by mouth 2 (two) times daily.   RANOLAZINE (RANEXA) 500 MG 12 HR TABLET    Take 1 tablet (500 mg total) by mouth 2 (two) times daily.     SIGNIFICANT DIAGNOSTIC EXAMS  TODAY:  01-31-17: chest x-ray: 1. No acute cardiopulmonary abnormalities. 2.  Aortic Atherosclerosis (ICD10-I70.0).  LABS REVIEWED: TODAY:   02-14-17: wbc 2.4; hgb 10.7; hct 32.0; mcv 81.4; plt 92; glucose 91; bun 19; creat 1.36; k+ 4.2; na++ 140; ca 9.1; GFR 35  Review of Systems  Reason unable to perform ROS: is a poor historian.  Constitutional: Negative for malaise/fatigue.  Respiratory: Negative for shortness of breath.   Cardiovascular: Negative for chest pain.  Gastrointestinal: Negative for abdominal pain.  Musculoskeletal: Negative for myalgias.       Has bilateral foot pain  Skin: Negative.   Psychiatric/Behavioral: The patient is not nervous/anxious.      Physical Exam  Constitutional: No distress.  cachexia   Eyes: Conjunctivae are normal.  Neck: Neck supple. No JVD present. No thyromegaly present.  Cardiovascular: Normal rate, regular rhythm and intact distal pulses.   Murmur heard. Respiratory: Effort normal and breath sounds normal. No respiratory distress. She has no wheezes.  GI: Soft. Bowel sounds are normal. She exhibits no distension. There is no tenderness.   Musculoskeletal: She exhibits no edema.  Able to move all extremities   Lymphadenopathy:    She has no cervical adenopathy.  Neurological: She is alert.  Skin: Skin is warm and dry. She is not diaphoretic.  Venous insuffiencey bilateral lower legs with skin discoloration     Psychiatric: She has a normal mood and affect.    ASSESSMENT/ PLAN:  1. End stage CAD: is status post MI (2014): has chest pain: no change in status  is not complaining of chest pain at this time.  Has prn ntg available as needed. Will take vicodin 5/325 mg every 4 hours as needed for pain   2. Hypertension: stable: b/p: 144/78: is presently not on medications will monitor  3. Pancytopenia: unchanged: plt 92; wbc: 2.4; will monitor  4. Osteoarthritis: unchanged: is generalized: has vicodin 5/325 mg every fours as needed for pain  5. Cachexia: current weight is 97 pounds; will give supplements as directed: the goal of her care is for comfort only   6. Gout: has bilateral feet pain: more than likely this pain is gout related; however; due to advanced age; will give her tylenol 650 mg twice daily and will monitor her status.     MD is aware of resident's narcotic use and is in agreement with current plan of care. We will attempt to wean resident as apropriate   Synthia Innocent NP [facility]  Contact [phone] Monday through Friday 8am- 5pm  After hours call [phone]

## 2017-02-20 DIAGNOSIS — I10 Essential (primary) hypertension: Secondary | ICD-10-CM | POA: Diagnosis not present

## 2017-02-20 DIAGNOSIS — M1993 Secondary osteoarthritis, unspecified site: Secondary | ICD-10-CM | POA: Diagnosis not present

## 2017-02-20 DIAGNOSIS — I495 Sick sinus syndrome: Secondary | ICD-10-CM | POA: Diagnosis not present

## 2017-02-20 DIAGNOSIS — I25119 Atherosclerotic heart disease of native coronary artery with unspecified angina pectoris: Secondary | ICD-10-CM | POA: Diagnosis not present

## 2017-02-20 DIAGNOSIS — E784 Other hyperlipidemia: Secondary | ICD-10-CM | POA: Diagnosis not present

## 2017-02-20 DIAGNOSIS — R64 Cachexia: Secondary | ICD-10-CM | POA: Diagnosis not present

## 2017-02-23 ENCOUNTER — Encounter: Payer: Self-pay | Admitting: Internal Medicine

## 2017-02-23 ENCOUNTER — Non-Acute Institutional Stay (SKILLED_NURSING_FACILITY): Payer: Medicare Other | Admitting: Internal Medicine

## 2017-02-23 DIAGNOSIS — M150 Primary generalized (osteo)arthritis: Secondary | ICD-10-CM

## 2017-02-23 DIAGNOSIS — I10 Essential (primary) hypertension: Secondary | ICD-10-CM | POA: Diagnosis not present

## 2017-02-23 DIAGNOSIS — N183 Chronic kidney disease, stage 3 unspecified: Secondary | ICD-10-CM

## 2017-02-23 DIAGNOSIS — I209 Angina pectoris, unspecified: Secondary | ICD-10-CM

## 2017-02-23 DIAGNOSIS — M1A9XX0 Chronic gout, unspecified, without tophus (tophi): Secondary | ICD-10-CM | POA: Diagnosis not present

## 2017-02-23 DIAGNOSIS — I495 Sick sinus syndrome: Secondary | ICD-10-CM | POA: Diagnosis not present

## 2017-02-23 DIAGNOSIS — L03116 Cellulitis of left lower limb: Secondary | ICD-10-CM | POA: Diagnosis not present

## 2017-02-23 DIAGNOSIS — M1993 Secondary osteoarthritis, unspecified site: Secondary | ICD-10-CM | POA: Diagnosis not present

## 2017-02-23 DIAGNOSIS — I25118 Atherosclerotic heart disease of native coronary artery with other forms of angina pectoris: Secondary | ICD-10-CM

## 2017-02-23 DIAGNOSIS — L03115 Cellulitis of right lower limb: Secondary | ICD-10-CM | POA: Diagnosis not present

## 2017-02-23 DIAGNOSIS — D696 Thrombocytopenia, unspecified: Secondary | ICD-10-CM

## 2017-02-23 DIAGNOSIS — M159 Polyosteoarthritis, unspecified: Secondary | ICD-10-CM

## 2017-02-23 DIAGNOSIS — I872 Venous insufficiency (chronic) (peripheral): Secondary | ICD-10-CM | POA: Diagnosis not present

## 2017-02-23 DIAGNOSIS — D631 Anemia in chronic kidney disease: Secondary | ICD-10-CM

## 2017-02-23 DIAGNOSIS — R627 Adult failure to thrive: Secondary | ICD-10-CM | POA: Diagnosis not present

## 2017-02-23 DIAGNOSIS — I25119 Atherosclerotic heart disease of native coronary artery with unspecified angina pectoris: Secondary | ICD-10-CM | POA: Diagnosis not present

## 2017-02-23 DIAGNOSIS — R64 Cachexia: Secondary | ICD-10-CM | POA: Diagnosis not present

## 2017-02-23 DIAGNOSIS — E784 Other hyperlipidemia: Secondary | ICD-10-CM | POA: Diagnosis not present

## 2017-02-23 NOTE — Progress Notes (Signed)
Patient ID: Ashley Duran, female   DOB: 1913/02/12, 83 y.o.   MRN: [id number]     HISTORY AND PHYSICAL   DATE:  February 23, 2017  Location:   [place] Room Number: 111 A Place of Service: SNF (31)   Extended Emergency Contact Information Primary Emergency Contact: Mitchell,Carolyn Address: [address]          [place] [address] [place] Phone: [phone] Relation: Grandaughter Secondary Emergency Contact: Knight,Ben  [place] Phone: [phone] Relation: None  Advanced Directive information Does Patient Have a Medical Advance Directive?: Yes (golden rod form pending), Would patient like information on creating a medical advance directive?: No - Patient declined, Type of Advance Directive: Out of facility DNR (pink MOST or yellow form), Pre-existing out of facility DNR order (yellow form or pink MOST form): Yellow form placed in chart (order not valid for inpatient use), Does patient want to make changes to medical advance directive?: No - Patient declined [facility] PT Chief Complaint  Patient presents with  . New Admit To SNF    Transfer in from  home on hospice    HPI:  83 yo female seen today as a new admission into SNF from home. She is on hospice 2/2 FTT, b/l LE cellulitis. She was seen in the ED on 7/28th for venous stasis dermatitis and again 8/11th for FTT. Hgb 10.7; Plts 91K; Cr 1.36. She will be a long term resident.  Today she reports she is ready to go home and is upset that her granddaughter "just left me here". She states she came to visit facility last week week her granddaughter and she ran off and left her. She has been unable to reach family by phone and states "I don't have any family". She states "I am not afraid to fight" and started to tell me about going to train track and "lying down when the train comes". No other concerns. Appetite poor and sleeps well. No nursing issues. No falls. Labs  from SNF reveal Hgb 10; Plts 89K; Cr 1.3; K 5.7. Pain is stable on norco prn. She has not needed prn SLNTG  Past Medical History:  Diagnosis Date  . Cachexia ([place])   . CAD Native   . Failure to thrive in adult   . Glaucoma   . GOUT   . Hyperlipidemia   . Hypertension   . Hypertensive heart disease   . Osteoarthritis   . Pancytopenia ([place])   . Sick sinus syndrome [facility])     Past Surgical History:  Procedure Laterality Date  . CATARACT EXTRACTION    . TOTAL HIP ARTHROPLASTY Left     Patient Care Team: Patient, No Pcp Per as PCP - General (General Practice)  Social History   Social History  . Marital status: Single    Spouse name: N/A  . Number of children: N/A  . Years of education: N/A   Occupational History  . Not on file.   Social History Main Topics  . Smoking status: Never Smoker  . Smokeless tobacco: Never Used  . Alcohol use No  . Drug use: No  . Sexual activity: No   Other Topics Concern  . Not on file   Social History Narrative  . No narrative on file     reports that she has never smoked. She has never used smokeless tobacco. She reports that she does not drink alcohol or use drugs.  History reviewed. No  pertinent family history. Family Status  Relation Status  . Father Deceased  . Mother Deceased  . Brother Deceased  . Brother Deceased  . Sister Deceased  . Sister Deceased    Immunization History  Administered Date(s) Administered  . Influenza Whole 05/17/2007  . PPD Test 02/18/2017    Allergies  Allergen Reactions  . Metoprolol     Bradycardia   . Simvastatin     Muscle aches     Medications: Patient's Medications  New Prescriptions   No medications on file  Previous Medications   ACETAMINOPHEN (TYLENOL) 325 MG TABLET    Take 650 mg by mouth 2 (two) times daily.   HYDROCODONE-ACETAMINOPHEN (NORCO/VICODIN) 5-325 MG TABLET    Take 1 tablet by mouth every 4 (four) hours as needed.   NITROGLYCERIN (NITROSTAT) 0.4 MG SL TABLET     Place 0.4 mg under the tongue every 5 (five) minutes as needed for chest pain.  Modified Medications   No medications on file  Discontinued Medications   No medications on file    Review of Systems  Musculoskeletal: Positive for arthralgias.  Psychiatric/Behavioral: Positive for dysphoric mood.  All other systems reviewed and are negative.   Vitals:   02/23/17 1103  BP: 140/86  Pulse: 64  Resp: 20  Temp: (!) 97 F (36.1 C)  SpO2: 96%  Weight: 97 lb (44 kg)  Height: 5' 3"  (1.6 m)   Body mass index is 17.18 kg/m.  Physical Exam  Constitutional: She is oriented to person, place, and time. She appears well-developed and well-nourished.  Frail appearing in NAD, sitting on edge of bed.  HENT:  Mouth/Throat: Oropharynx is clear and moist. No oropharyngeal exudate.  MMM; no oral thrush  Eyes: Pupils are equal, round, and reactive to light. No scleral icterus.  Neck: Neck supple. Carotid bruit is not present. No tracheal deviation present. No thyromegaly present.  Cardiovascular: Normal rate, regular rhythm and intact distal pulses.  Exam reveals no gallop and no friction rub.   Murmur (1/6 SEM) heard. Chronic venous stasis changes b/l LE; +1 pitting LE edema b/l. No calf TTP  Pulmonary/Chest: Effort normal and breath sounds normal. No stridor. No respiratory distress. She has no wheezes. She has no rales.  Abdominal: Soft. Normal appearance and bowel sounds are normal. She exhibits no distension and no mass. There is no hepatomegaly. There is no tenderness. There is no rigidity, no rebound and no guarding. No hernia.  Musculoskeletal: She exhibits edema.  Lymphadenopathy:    She has no cervical adenopathy.  Neurological: She is alert and oriented to person, place, and time.  Skin: Skin is warm and dry. Rash (b/l LE with increased redness and warmth to touch; TTP) noted.  Psychiatric: Thought content normal. Her mood appears anxious. She is agitated.     Labs  reviewed: Abstract on 02/20/2017  Component Date Value Ref Range Status  . Hemoglobin 02/19/2017 10.0* 12.0 - 16.0 Final  . HCT 02/19/2017 32* 36 - 46 Final  . Neutrophils Absolute 02/19/2017 1   Final  . Platelets 02/19/2017 89* 150 - 399 Final  . WBC 02/19/2017 2.3   Final  . Glucose 02/19/2017 93   Final  . BUN 02/19/2017 22* 4 - 21 Final  . Creatinine 02/19/2017 1.3* 0.5 - 1.1 Final  . Potassium 02/19/2017 5.7* 3.4 - 5.3 Final  . Sodium 02/19/2017 141  137 - 147 Final  . Alkaline Phosphatase 02/19/2017 63  25 - 125 Final  . ALT 02/19/2017  5* 7 - 35 Final  . AST 02/19/2017 17  13 - 35 Final  . Bilirubin, Total 02/19/2017 0.2   Final  Admission on 02/14/2017, Discharged on 02/14/2017  Component Date Value Ref Range Status  . Sodium 02/14/2017 140  135 - 145 mmol/L Final  . Potassium 02/14/2017 4.2  3.5 - 5.1 mmol/L Final  . Chloride 02/14/2017 110  101 - 111 mmol/L Final  . CO2 02/14/2017 23  22 - 32 mmol/L Final  . Glucose, Bld 02/14/2017 91  65 - 99 mg/dL Final  . BUN 02/14/2017 19  6 - 20 mg/dL Final  . Creatinine, Ser 02/14/2017 1.36* 0.44 - 1.00 mg/dL Final  . Calcium 02/14/2017 9.1  8.9 - 10.3 mg/dL Final  . GFR calc non Af Amer 02/14/2017 30* >60 mL/min Final  . GFR calc Af Amer 02/14/2017 35* >60 mL/min Final   Comment: (NOTE) The eGFR has been calculated using the CKD EPI equation. This calculation has not been validated in all clinical situations. eGFR's persistently <60 mL/min signify possible Chronic Kidney Disease.   . Anion gap 02/14/2017 7  5 - 15 Final  . WBC 02/14/2017 2.4* 4.0 - 10.5 K/uL Final  . RBC 02/14/2017 3.93  3.87 - 5.11 MIL/uL Final  . Hemoglobin 02/14/2017 10.7* 12.0 - 15.0 g/dL Final  . HCT 02/14/2017 32.0* 36.0 - 46.0 % Final  . MCV 02/14/2017 81.4  78.0 - 100.0 fL Final  . MCH 02/14/2017 27.2  26.0 - 34.0 pg Final  . MCHC 02/14/2017 33.4  30.0 - 36.0 g/dL Final  . RDW 02/14/2017 15.1  11.5 - 15.5 % Final  . Platelets 02/14/2017 92* 150 -  400 K/uL Final   Comment: RESULT REPEATED AND VERIFIED SPECIMEN CHECKED FOR CLOTS PLATELET COUNT CONFIRMED BY SMEAR   . Neutrophils Relative % 02/14/2017 46  % Final  . Neutro Abs 02/14/2017 1.1* 1.7 - 7.7 K/uL Final  . Lymphocytes Relative 02/14/2017 43  % Final  . Lymphs Abs 02/14/2017 1.0  0.7 - 4.0 K/uL Final  . Monocytes Relative 02/14/2017 8  % Final  . Monocytes Absolute 02/14/2017 0.2  0.1 - 1.0 K/uL Final  . Eosinophils Relative 02/14/2017 3  % Final  . Eosinophils Absolute 02/14/2017 0.1  0.0 - 0.7 K/uL Final  . Basophils Relative 02/14/2017 0  % Final  . Basophils Absolute 02/14/2017 0.0  0.0 - 0.1 K/uL Final  Admission on 01/31/2017, Discharged on 02/01/2017  Component Date Value Ref Range Status  . Sodium 01/31/2017 139  135 - 145 mmol/L Final  . Potassium 01/31/2017 4.4  3.5 - 5.1 mmol/L Final  . Chloride 01/31/2017 112* 101 - 111 mmol/L Final  . CO2 01/31/2017 20* 22 - 32 mmol/L Final  . Glucose, Bld 01/31/2017 94  65 - 99 mg/dL Final  . BUN 01/31/2017 20  6 - 20 mg/dL Final  . Creatinine, Ser 01/31/2017 1.56* 0.44 - 1.00 mg/dL Final  . Calcium 01/31/2017 8.9  8.9 - 10.3 mg/dL Final  . GFR calc non Af Amer 01/31/2017 26* >60 mL/min Final  . GFR calc Af Amer 01/31/2017 30* >60 mL/min Final   Comment: (NOTE) The eGFR has been calculated using the CKD EPI equation. This calculation has not been validated in all clinical situations. eGFR's persistently <60 mL/min signify possible Chronic Kidney Disease.   . Anion gap 01/31/2017 7  5 - 15 Final  . WBC 01/31/2017 3.2* 4.0 - 10.5 K/uL Final  . RBC 01/31/2017 3.80* 3.87 - 5.11  MIL/uL Final  . Hemoglobin 01/31/2017 10.2* 12.0 - 15.0 g/dL Final  . HCT 01/31/2017 30.9* 36.0 - 46.0 % Final  . MCV 01/31/2017 81.3  78.0 - 100.0 fL Final  . MCH 01/31/2017 26.8  26.0 - 34.0 pg Final  . MCHC 01/31/2017 33.0  30.0 - 36.0 g/dL Final  . RDW 01/31/2017 14.4  11.5 - 15.5 % Final  . Platelets 01/31/2017 93* 150 - 400 K/uL Final    Comment: SPECIMEN CHECKED FOR CLOTS REPEATED TO VERIFY PLATELET COUNT CONFIRMED BY SMEAR   . Troponin i, poc 01/31/2017 0.05  0.00 - 0.08 ng/mL Final  . Comment 3 01/31/2017          Final   Comment: Due to the release kinetics of cTnI, a negative result within the first hours of the onset of symptoms does not rule out myocardial infarction with certainty. If myocardial infarction is still suspected, repeat the test at appropriate intervals.   . Glucose-Capillary 01/31/2017 89  65 - 99 mg/dL Final  . Comment 1 01/31/2017 Document in Chart   Final    Dg Chest 2 View  Result Date: 01/31/2017 CLINICAL DATA:  Chest pain EXAM: CHEST  2 VIEW COMPARISON:  5/16/5 FINDINGS: Mild cardiac enlargement. No pleural effusion or edema identified. Aortic atherosclerosis is noted. No airspace opacities. IMPRESSION: 1. No acute cardiopulmonary abnormalities. 2.  Aortic Atherosclerosis (ICD10-I70.0). Electronically Signed   By: Kerby Moors M.D.   On: 01/31/2017 16:13     Assessment/Plan   ICD-10-CM   1. FTT (failure to thrive) in adult R62.7   2. Bilateral lower leg cellulitis L03.116    L03.115    refuses tx  3. Stage 3 chronic kidney disease N18.3   4. Anemia in stage 3 chronic kidney disease N18.3    D63.1   5. Thrombocytopenia ([place]) D69.6   6. Primary osteoarthritis involving multiple joints M15.0   7. Atherosclerosis of native coronary artery of native heart with other form of angina pectoris ([place]) I25.118   8. Chronic gout without tophus, unspecified cause, unspecified site - no recent flares M1A.[id number]   9. Chronic venous stasis dermatitis of both lower extremities I87.2      [facility]ospice to follow ([facility])  Cont current meds as ordered  Nutritional supplements as ordered  GOAL: long term care. Prognosis is poor. She is a hospice pt. Communicated with pt and nursing.  Will follow  Geraldo Haris S. Perlie Gold  [facility] [address] [place], [place] [address] [phone] Cell (Monday-Friday 8 AM - 5 PM) [phone] After 5 PM and follow prompts

## 2017-02-27 DIAGNOSIS — E784 Other hyperlipidemia: Secondary | ICD-10-CM | POA: Diagnosis not present

## 2017-02-27 DIAGNOSIS — I495 Sick sinus syndrome: Secondary | ICD-10-CM | POA: Diagnosis not present

## 2017-02-27 DIAGNOSIS — R64 Cachexia: Secondary | ICD-10-CM | POA: Diagnosis not present

## 2017-02-27 DIAGNOSIS — I25119 Atherosclerotic heart disease of native coronary artery with unspecified angina pectoris: Secondary | ICD-10-CM | POA: Diagnosis not present

## 2017-02-27 DIAGNOSIS — I10 Essential (primary) hypertension: Secondary | ICD-10-CM | POA: Diagnosis not present

## 2017-02-27 DIAGNOSIS — M1993 Secondary osteoarthritis, unspecified site: Secondary | ICD-10-CM | POA: Diagnosis not present

## 2017-02-28 DIAGNOSIS — R64 Cachexia: Secondary | ICD-10-CM | POA: Diagnosis not present

## 2017-02-28 DIAGNOSIS — M1993 Secondary osteoarthritis, unspecified site: Secondary | ICD-10-CM | POA: Diagnosis not present

## 2017-02-28 DIAGNOSIS — I495 Sick sinus syndrome: Secondary | ICD-10-CM | POA: Diagnosis not present

## 2017-02-28 DIAGNOSIS — I25119 Atherosclerotic heart disease of native coronary artery with unspecified angina pectoris: Secondary | ICD-10-CM | POA: Diagnosis not present

## 2017-02-28 DIAGNOSIS — I10 Essential (primary) hypertension: Secondary | ICD-10-CM | POA: Diagnosis not present

## 2017-02-28 DIAGNOSIS — E784 Other hyperlipidemia: Secondary | ICD-10-CM | POA: Diagnosis not present

## 2017-03-01 DIAGNOSIS — E784 Other hyperlipidemia: Secondary | ICD-10-CM | POA: Diagnosis not present

## 2017-03-01 DIAGNOSIS — I25119 Atherosclerotic heart disease of native coronary artery with unspecified angina pectoris: Secondary | ICD-10-CM | POA: Diagnosis not present

## 2017-03-01 DIAGNOSIS — M1993 Secondary osteoarthritis, unspecified site: Secondary | ICD-10-CM | POA: Diagnosis not present

## 2017-03-01 DIAGNOSIS — I495 Sick sinus syndrome: Secondary | ICD-10-CM | POA: Diagnosis not present

## 2017-03-01 DIAGNOSIS — R64 Cachexia: Secondary | ICD-10-CM | POA: Diagnosis not present

## 2017-03-01 DIAGNOSIS — I10 Essential (primary) hypertension: Secondary | ICD-10-CM | POA: Diagnosis not present

## 2017-03-02 DIAGNOSIS — I25119 Atherosclerotic heart disease of native coronary artery with unspecified angina pectoris: Secondary | ICD-10-CM | POA: Diagnosis not present

## 2017-03-02 DIAGNOSIS — N183 Chronic kidney disease, stage 3 unspecified: Secondary | ICD-10-CM | POA: Insufficient documentation

## 2017-03-02 DIAGNOSIS — M1993 Secondary osteoarthritis, unspecified site: Secondary | ICD-10-CM | POA: Diagnosis not present

## 2017-03-02 DIAGNOSIS — I10 Essential (primary) hypertension: Secondary | ICD-10-CM | POA: Diagnosis not present

## 2017-03-02 DIAGNOSIS — D631 Anemia in chronic kidney disease: Secondary | ICD-10-CM | POA: Insufficient documentation

## 2017-03-02 DIAGNOSIS — F0390 Unspecified dementia without behavioral disturbance: Secondary | ICD-10-CM | POA: Insufficient documentation

## 2017-03-02 DIAGNOSIS — E784 Other hyperlipidemia: Secondary | ICD-10-CM | POA: Diagnosis not present

## 2017-03-02 DIAGNOSIS — L03115 Cellulitis of right lower limb: Secondary | ICD-10-CM | POA: Insufficient documentation

## 2017-03-02 DIAGNOSIS — I495 Sick sinus syndrome: Secondary | ICD-10-CM | POA: Diagnosis not present

## 2017-03-02 DIAGNOSIS — L03116 Cellulitis of left lower limb: Secondary | ICD-10-CM

## 2017-03-02 DIAGNOSIS — R64 Cachexia: Secondary | ICD-10-CM | POA: Diagnosis not present

## 2017-03-02 DIAGNOSIS — D696 Thrombocytopenia, unspecified: Secondary | ICD-10-CM | POA: Insufficient documentation

## 2017-03-02 DIAGNOSIS — I872 Venous insufficiency (chronic) (peripheral): Secondary | ICD-10-CM | POA: Insufficient documentation

## 2017-03-03 DIAGNOSIS — I495 Sick sinus syndrome: Secondary | ICD-10-CM | POA: Diagnosis not present

## 2017-03-03 DIAGNOSIS — I10 Essential (primary) hypertension: Secondary | ICD-10-CM | POA: Diagnosis not present

## 2017-03-03 DIAGNOSIS — E784 Other hyperlipidemia: Secondary | ICD-10-CM | POA: Diagnosis not present

## 2017-03-03 DIAGNOSIS — I25119 Atherosclerotic heart disease of native coronary artery with unspecified angina pectoris: Secondary | ICD-10-CM | POA: Diagnosis not present

## 2017-03-03 DIAGNOSIS — R64 Cachexia: Secondary | ICD-10-CM | POA: Diagnosis not present

## 2017-03-03 DIAGNOSIS — M1993 Secondary osteoarthritis, unspecified site: Secondary | ICD-10-CM | POA: Diagnosis not present

## 2017-03-07 DIAGNOSIS — R64 Cachexia: Secondary | ICD-10-CM | POA: Diagnosis not present

## 2017-03-07 DIAGNOSIS — M1993 Secondary osteoarthritis, unspecified site: Secondary | ICD-10-CM | POA: Diagnosis not present

## 2017-03-07 DIAGNOSIS — I10 Essential (primary) hypertension: Secondary | ICD-10-CM | POA: Diagnosis not present

## 2017-03-07 DIAGNOSIS — I495 Sick sinus syndrome: Secondary | ICD-10-CM | POA: Diagnosis not present

## 2017-03-07 DIAGNOSIS — I25119 Atherosclerotic heart disease of native coronary artery with unspecified angina pectoris: Secondary | ICD-10-CM | POA: Diagnosis not present

## 2017-03-07 DIAGNOSIS — H409 Unspecified glaucoma: Secondary | ICD-10-CM | POA: Diagnosis not present

## 2017-03-07 DIAGNOSIS — E784 Other hyperlipidemia: Secondary | ICD-10-CM | POA: Diagnosis not present

## 2017-03-12 DIAGNOSIS — I25119 Atherosclerotic heart disease of native coronary artery with unspecified angina pectoris: Secondary | ICD-10-CM | POA: Diagnosis not present

## 2017-03-12 DIAGNOSIS — E784 Other hyperlipidemia: Secondary | ICD-10-CM | POA: Diagnosis not present

## 2017-03-12 DIAGNOSIS — I495 Sick sinus syndrome: Secondary | ICD-10-CM | POA: Diagnosis not present

## 2017-03-12 DIAGNOSIS — R64 Cachexia: Secondary | ICD-10-CM | POA: Diagnosis not present

## 2017-03-12 DIAGNOSIS — I10 Essential (primary) hypertension: Secondary | ICD-10-CM | POA: Diagnosis not present

## 2017-03-12 DIAGNOSIS — M1993 Secondary osteoarthritis, unspecified site: Secondary | ICD-10-CM | POA: Diagnosis not present

## 2017-03-13 DIAGNOSIS — M1993 Secondary osteoarthritis, unspecified site: Secondary | ICD-10-CM | POA: Diagnosis not present

## 2017-03-13 DIAGNOSIS — R64 Cachexia: Secondary | ICD-10-CM | POA: Diagnosis not present

## 2017-03-13 DIAGNOSIS — E784 Other hyperlipidemia: Secondary | ICD-10-CM | POA: Diagnosis not present

## 2017-03-13 DIAGNOSIS — I25119 Atherosclerotic heart disease of native coronary artery with unspecified angina pectoris: Secondary | ICD-10-CM | POA: Diagnosis not present

## 2017-03-13 DIAGNOSIS — I10 Essential (primary) hypertension: Secondary | ICD-10-CM | POA: Diagnosis not present

## 2017-03-13 DIAGNOSIS — I495 Sick sinus syndrome: Secondary | ICD-10-CM | POA: Diagnosis not present

## 2017-03-17 DIAGNOSIS — M1993 Secondary osteoarthritis, unspecified site: Secondary | ICD-10-CM | POA: Diagnosis not present

## 2017-03-17 DIAGNOSIS — R64 Cachexia: Secondary | ICD-10-CM | POA: Diagnosis not present

## 2017-03-17 DIAGNOSIS — I495 Sick sinus syndrome: Secondary | ICD-10-CM | POA: Diagnosis not present

## 2017-03-17 DIAGNOSIS — I25119 Atherosclerotic heart disease of native coronary artery with unspecified angina pectoris: Secondary | ICD-10-CM | POA: Diagnosis not present

## 2017-03-17 DIAGNOSIS — E784 Other hyperlipidemia: Secondary | ICD-10-CM | POA: Diagnosis not present

## 2017-03-17 DIAGNOSIS — I10 Essential (primary) hypertension: Secondary | ICD-10-CM | POA: Diagnosis not present

## 2017-03-19 DIAGNOSIS — I25119 Atherosclerotic heart disease of native coronary artery with unspecified angina pectoris: Secondary | ICD-10-CM | POA: Diagnosis not present

## 2017-03-19 DIAGNOSIS — R64 Cachexia: Secondary | ICD-10-CM | POA: Diagnosis not present

## 2017-03-19 DIAGNOSIS — E784 Other hyperlipidemia: Secondary | ICD-10-CM | POA: Diagnosis not present

## 2017-03-19 DIAGNOSIS — M1993 Secondary osteoarthritis, unspecified site: Secondary | ICD-10-CM | POA: Diagnosis not present

## 2017-03-19 DIAGNOSIS — I10 Essential (primary) hypertension: Secondary | ICD-10-CM | POA: Diagnosis not present

## 2017-03-19 DIAGNOSIS — I495 Sick sinus syndrome: Secondary | ICD-10-CM | POA: Diagnosis not present

## 2017-03-26 DIAGNOSIS — R64 Cachexia: Secondary | ICD-10-CM | POA: Diagnosis not present

## 2017-03-26 DIAGNOSIS — E784 Other hyperlipidemia: Secondary | ICD-10-CM | POA: Diagnosis not present

## 2017-03-26 DIAGNOSIS — I10 Essential (primary) hypertension: Secondary | ICD-10-CM | POA: Diagnosis not present

## 2017-03-26 DIAGNOSIS — I495 Sick sinus syndrome: Secondary | ICD-10-CM | POA: Diagnosis not present

## 2017-03-26 DIAGNOSIS — M1993 Secondary osteoarthritis, unspecified site: Secondary | ICD-10-CM | POA: Diagnosis not present

## 2017-03-26 DIAGNOSIS — I25119 Atherosclerotic heart disease of native coronary artery with unspecified angina pectoris: Secondary | ICD-10-CM | POA: Diagnosis not present

## 2017-04-03 DIAGNOSIS — M1993 Secondary osteoarthritis, unspecified site: Secondary | ICD-10-CM | POA: Diagnosis not present

## 2017-04-03 DIAGNOSIS — E784 Other hyperlipidemia: Secondary | ICD-10-CM | POA: Diagnosis not present

## 2017-04-03 DIAGNOSIS — I10 Essential (primary) hypertension: Secondary | ICD-10-CM | POA: Diagnosis not present

## 2017-04-03 DIAGNOSIS — I25119 Atherosclerotic heart disease of native coronary artery with unspecified angina pectoris: Secondary | ICD-10-CM | POA: Diagnosis not present

## 2017-04-03 DIAGNOSIS — I495 Sick sinus syndrome: Secondary | ICD-10-CM | POA: Diagnosis not present

## 2017-04-03 DIAGNOSIS — R64 Cachexia: Secondary | ICD-10-CM | POA: Diagnosis not present

## 2017-04-06 DIAGNOSIS — R64 Cachexia: Secondary | ICD-10-CM | POA: Diagnosis not present

## 2017-04-06 DIAGNOSIS — H409 Unspecified glaucoma: Secondary | ICD-10-CM | POA: Diagnosis not present

## 2017-04-06 DIAGNOSIS — I495 Sick sinus syndrome: Secondary | ICD-10-CM | POA: Diagnosis not present

## 2017-04-06 DIAGNOSIS — M1993 Secondary osteoarthritis, unspecified site: Secondary | ICD-10-CM | POA: Diagnosis not present

## 2017-04-06 DIAGNOSIS — I25119 Atherosclerotic heart disease of native coronary artery with unspecified angina pectoris: Secondary | ICD-10-CM | POA: Diagnosis not present

## 2017-04-06 DIAGNOSIS — E785 Hyperlipidemia, unspecified: Secondary | ICD-10-CM | POA: Diagnosis not present

## 2017-04-06 DIAGNOSIS — I10 Essential (primary) hypertension: Secondary | ICD-10-CM | POA: Diagnosis not present

## 2017-04-29 ENCOUNTER — Encounter (HOSPITAL_COMMUNITY): Payer: Self-pay

## 2017-04-29 ENCOUNTER — Emergency Department (HOSPITAL_COMMUNITY)
Admission: EM | Admit: 2017-04-29 | Discharge: 2017-04-29 | Disposition: A | Discharge status: Home / Self Care | Source: Home / Self Care | Attending: Emergency Medicine | Admitting: Emergency Medicine

## 2017-04-29 DIAGNOSIS — I119 Hypertensive heart disease without heart failure: Secondary | ICD-10-CM

## 2017-04-29 DIAGNOSIS — I129 Hypertensive chronic kidney disease with stage 1 through stage 4 chronic kidney disease, or unspecified chronic kidney disease: Secondary | ICD-10-CM | POA: Insufficient documentation

## 2017-04-29 DIAGNOSIS — L03116 Cellulitis of left lower limb: Secondary | ICD-10-CM

## 2017-04-29 DIAGNOSIS — M79604 Pain in right leg: Secondary | ICD-10-CM | POA: Insufficient documentation

## 2017-04-29 DIAGNOSIS — N183 Chronic kidney disease, stage 3 (moderate): Secondary | ICD-10-CM | POA: Insufficient documentation

## 2017-04-29 DIAGNOSIS — I2510 Atherosclerotic heart disease of native coronary artery without angina pectoris: Secondary | ICD-10-CM

## 2017-04-29 DIAGNOSIS — I252 Old myocardial infarction: Secondary | ICD-10-CM

## 2017-04-29 DIAGNOSIS — L03115 Cellulitis of right lower limb: Secondary | ICD-10-CM

## 2017-04-29 LAB — BASIC METABOLIC PANEL
ANION GAP: 9 (ref 5–15)
BUN: 20 mg/dL (ref 6–20)
CHLORIDE: 108 mmol/L (ref 101–111)
CO2: 19 mmol/L — ABNORMAL LOW (ref 22–32)
Calcium: 8.6 mg/dL — ABNORMAL LOW (ref 8.9–10.3)
Creatinine, Ser: 1.38 mg/dL — ABNORMAL HIGH (ref 0.44–1.00)
GFR, EST AFRICAN AMERICAN: 35 mL/min — AB (ref >60)
GFR, EST NON AFRICAN AMERICAN: 30 mL/min — AB (ref >60)
Glucose, Bld: 83 mg/dL (ref 65–99)
POTASSIUM: 4.2 mmol/L (ref 3.5–5.1)
SODIUM: 136 mmol/L (ref 135–145)

## 2017-04-29 LAB — CBC
HCT: 30.8 % — ABNORMAL LOW (ref 36.0–46.0)
HEMOGLOBIN: 9.8 g/dL — AB (ref 12.0–15.0)
MCH: 26.1 pg (ref 26.0–34.0)
MCHC: 31.8 g/dL (ref 30.0–36.0)
MCV: 81.9 fL (ref 78.0–100.0)
PLATELETS: 167 10*3/uL (ref 150–400)
RBC: 3.76 MIL/uL — AB (ref 3.87–5.11)
RDW: 16.3 % — ABNORMAL HIGH (ref 11.5–15.5)
WBC: 3.8 10*3/uL — AB (ref 4.0–10.5)

## 2017-04-29 MED ORDER — CEPHALEXIN 250 MG PO CAPS
500.0000 mg | ORAL_CAPSULE | Freq: Once | ORAL | Status: AC
  Administered 2017-04-29: 500 mg via ORAL
  Filled 2017-04-29: qty 2

## 2017-04-29 MED ORDER — CEPHALEXIN 500 MG PO CAPS: 500.0000 mg | ORAL_CAPSULE | Freq: Two times a day (BID) | ORAL | 0 refills | Status: DC

## 2017-04-29 NOTE — ED Provider Notes (Signed)
[facility] Provider Note   CSN: [id number] Arrival date & time: 04/29/17  [id number]     History   Chief Complaint No chief complaint on file.   HPI Ashley Duran is a [age] y.o. female.  HPI  [age] y.o. female with a hx of CAD Native, HTN, HLD, presents to the Emergency Department today via PTAR from home due to bilateral leg pain. Notes welling and redness. Pt states that "sap is coming out of my legs." Cannot give a duration of symptoms. Denies fevers. Also notes a leg wound on right anterior aspect of shin. Pt seen on 02-14-17 with similar presentation with BLE cellulitis. Pt given Rx Keflex. DCed to nursing facility. Pt states that she lives alone currently. No CP/SOB/ABD pain. No cough/congestion. Pt is Hospice patient. Pt is DNR. No other symptoms noted.   Past Medical History:  Diagnosis Date  . Cachexia ([facility])   . CAD Native   . Failure to thrive in adult   . Glaucoma   . GOUT   . Hyperlipidemia   . Hypertension   . Hypertensive heart disease   . Osteoarthritis   . Pancytopenia ([facility])   . Sick sinus syndrome [facility](HCC)     Patient Active Problem List   Diagnosis Date Noted  . FTT (failure to thrive) in adult 03/02/2017  . Bilateral lower leg cellulitis 03/02/2017  . Stage 3 chronic kidney disease ([facility]) 03/02/2017  . Anemia in stage 3 chronic kidney disease ([facility]) 03/02/2017  . Thrombocytopenia ([facility]) 03/02/2017  . Chronic venous stasis dermatitis of both lower extremities 03/02/2017  . History of MI (myocardial infarction) 02/19/2017  . Chest pain 01/31/2017  . Non-STEMI (non-ST elevated myocardial infarction) ([facility]) 05/19/2013  . Pancytopenia ([facility])   . Gout 05/17/2007  . Hypertensive heart disease   . Coronary atherosclerosis   . Osteoarthritis     Past Surgical History:  Procedure Laterality Date  . CATARACT EXTRACTION    . TOTAL HIP ARTHROPLASTY Left     OB History    No data available       Home Medications     Prior to Admission medications   Medication Sig Start Date End Date Taking? Authorizing Provider  acetaminophen (TYLENOL) 325 MG tablet Take 650 mg by mouth 2 (two) times daily.    [provider]  HYDROcodone-acetaminophen (NORCO/VICODIN) 5-325 MG tablet Take 1 tablet by mouth every 4 (four) hours as needed. 02/14/17   Jacalyn LefevreHaviland, Julie, MD  nitroGLYCERIN (NITROSTAT) 0.4 MG SL tablet Place 0.4 mg under the tongue every 5 (five) minutes as needed for chest pain.    [provider]    Family History No family history on file.  Social History Social History  Substance Use Topics  . Smoking status: Never Smoker  . Smokeless tobacco: Never Used  . Alcohol use No     Allergies   Metoprolol and Simvastatin   Review of Systems Review of Systems ROS reviewed and all are negative for acute change except as noted in the HPI.  Physical Exam Updated Vital Signs BP (!) 141/102 (BP Location: Left Arm)   Pulse 71   Temp (!) 97.4 F (36.3 C)   Resp 15   LMP  (LMP Unknown)   SpO2 100%   Physical Exam  Constitutional: She is oriented to person, place, and time. She appears well-developed and well-nourished. No distress.  HENT:  Head: Normocephalic and atraumatic.  Right Ear: Tympanic membrane, external ear and ear canal  normal.  Left Ear: Tympanic membrane, external ear and ear canal normal.  Nose: Nose normal.  Mouth/Throat: Uvula is midline, oropharynx is clear and moist and mucous membranes are normal. No trismus in the jaw. No oropharyngeal exudate, posterior oropharyngeal erythema or tonsillar abscesses.  Eyes: Pupils are equal, round, and reactive to light. EOM are normal.  Neck: Normal range of motion. Neck supple. No tracheal deviation present.  Cardiovascular: Normal rate, regular rhythm, S1 normal, S2 normal, normal heart sounds, intact distal pulses and normal pulses.   Pulmonary/Chest: Effort normal and breath sounds normal. No respiratory distress. She  has no decreased breath sounds. She has no wheezes. She has no rhonchi. She has no rales.  Abdominal: Normal appearance and bowel sounds are normal. There is no tenderness.  Musculoskeletal: Normal range of motion.  BLE erythema with cellulitic infection. Weeping noted. Open 1-2cm ulcer on right anterior shin. No pitting edema. Distal pulses appreciated.   Neurological: She is alert and oriented to person, place, and time.  Skin: Skin is warm and dry.  Psychiatric: She has a normal mood and affect. Her speech is normal and behavior is normal. Thought content normal.  Nursing note and vitals reviewed.    ED Treatments / Results  Labs (all labs ordered are listed, but only abnormal results are displayed) Labs Reviewed  CBC - Abnormal; Notable for the following:       Result Value   WBC 3.8 (*)    RBC 3.76 (*)    Hemoglobin 9.8 (*)    HCT 30.8 (*)    RDW 16.3 (*)    All other components within normal limits  BASIC METABOLIC PANEL - Abnormal; Notable for the following:    CO2 19 (*)    Creatinine, Ser 1.38 (*)    Calcium 8.6 (*)    GFR calc non Af Amer 30 (*)    GFR calc Af Amer 35 (*)    All other components within normal limits    EKG  EKG Interpretation None       Radiology No results found.  Procedures Procedures (including critical care time)  Medications Ordered in ED Medications - No data to display   Initial Impression / Assessment and Plan / ED Course  I have reviewed the triage vital signs and the nursing notes.  Pertinent labs & imaging results that were available during my care of the patient were reviewed by me and considered in my medical decision making (see chart for details).  Final Clinical Impressions(s) / ED Diagnoses  {I have reviewed and evaluated the relevant laboratory values.   {I have reviewed the relevant previous healthcare records.  {I obtained HPI from historian. {Patient discussed with supervising physician.  ED  Course:  Assessment: Pt is a 83 y.o. female  with a hx of CAD Native, HTN, HLD, presents to the Emergency Department today via PTAR from home due to bilateral leg pain. Notes welling and redness. Pt states that "sap is coming out of my legs." Cannot give a duration of symptoms. Denies fevers. Also notes a leg wound on right anterior aspect of shin. Pt seen on 02-14-17 with similar presentation with BLE cellulitis. Pt given Rx Keflex. DCed to nursing facility. Pt states that she lives alone currently. No CP/SOB/ABD pain. No cough/congestion. Pt is Hospice patient. Pt is DNR. On exam, pt in NAD. Nontoxic/nonseptic appearing. VSS. Afebrile. Lungs CTA. Heart RRR. Abdomen nontender soft. BLE erythema with cellulitic infection. Weeping noted. Open 1-2cm ulcer on right  anterior shin. No pitting edema. Distal pulses appreciated. CBC unremarkable. BMP unremarkable. Suspect cellulitis. Seen by supervising physician. Given Keflex in ED. Notified Hospice. Plan is to DC home with follow up to PCP. At time of discharge, Patient is in no acute distress. Vital Signs are stable. Patient is able to ambulate. Patient able to tolerate PO.   Disposition/Plan:  DC Home Additional Verbal discharge instructions given and discussed with patient.  Pt Instructed to f/u with PCP in the next week for evaluation and treatment of symptoms. Return precautions given Pt acknowledges and agrees with plan  Supervising Physician Gilda Crease, *  Final diagnoses:  Cellulitis of right leg  Left leg cellulitis    New Prescriptions New Prescriptions   No medications on file     Audry Pili, PA-C 04/29/17 0830    Gilda Crease, MD 04/30/17 [phone]

## 2017-04-29 NOTE — Discharge Instructions (Signed)
Please read and follow all provided instructions.  Your diagnoses today include:  1. Cellulitis of right leg   2. Left leg cellulitis     Tests performed today include: Vital signs. See below for your results today.   Medications prescribed:  Take as prescribed   Home care instructions:  Follow any educational materials contained in this packet.  Follow-up instructions: Please follow-up with your primary care provider for further evaluation of symptoms and treatment   Return instructions:  Please return to the Emergency Department if you do not get better, if you get worse, or new symptoms OR  - Fever (temperature greater than 101.4F)  - Bleeding that does not stop with holding pressure to the area    -Severe pain (please note that you may be more sore the day after your accident)  - Chest Pain  - Difficulty breathing  - Severe nausea or vomiting  - Inability to tolerate food and liquids  - Passing out  - Skin becoming red around your wounds  - Change in mental status (confusion or lethargy)  - New numbness or weakness    Please return if you have any other emergent concerns.  Additional Information:  Your vital signs today were: BP (!) 141/102 (BP Location: Left Arm)    Pulse 71    Temp (!) 97.4 F (36.3 C)    Resp 15    LMP  (LMP Unknown)    SpO2 100%  If your blood pressure (BP) was elevated above 135/85 this visit, please have this repeated by your doctor within one month. --------------

## 2017-04-29 NOTE — ED Notes (Signed)
Called and spoke with granddaughter Eber JonesCarolyn [phone] at request of patient , she is aware patient has a RX. And will need to get it filled.

## 2017-04-29 NOTE — Discharge Planning (Signed)
EDCM confirmed with [facility] that pt is in their care.  EDCM confirmed that [facility] will change leg dressings as directed by EDP.

## 2017-04-29 NOTE — ED Triage Notes (Signed)
Pt comes from home via PTAR, c/o of bilateral leg pain with redness and warmth noted. Drainage from both legs, denies fevers, r leg has quarter size wound on shin area.

## 2017-04-29 NOTE — ED Notes (Signed)
Pt unable to sign discharge 

## 2017-05-01 ENCOUNTER — Encounter (HOSPITAL_COMMUNITY): Payer: Self-pay | Admitting: Emergency Medicine

## 2017-05-01 ENCOUNTER — Inpatient Hospital Stay (HOSPITAL_COMMUNITY)
Admission: EM | Admit: 2017-05-01 | Discharge: 2017-05-05 | DRG: 884 | Disposition: A | Discharge status: Hospice | Attending: Student in an Organized Health Care Education/Training Program | Admitting: Student in an Organized Health Care Education/Training Program

## 2017-05-01 DIAGNOSIS — R404 Transient alteration of awareness: Secondary | ICD-10-CM | POA: Diagnosis not present

## 2017-05-01 DIAGNOSIS — I10 Essential (primary) hypertension: Secondary | ICD-10-CM | POA: Diagnosis present

## 2017-05-01 DIAGNOSIS — G909 Disorder of the autonomic nervous system, unspecified: Secondary | ICD-10-CM | POA: Diagnosis present

## 2017-05-01 DIAGNOSIS — L280 Lichen simplex chronicus: Secondary | ICD-10-CM | POA: Diagnosis present

## 2017-05-01 DIAGNOSIS — R627 Adult failure to thrive: Secondary | ICD-10-CM | POA: Diagnosis present

## 2017-05-01 DIAGNOSIS — R030 Elevated blood-pressure reading, without diagnosis of hypertension: Secondary | ICD-10-CM | POA: Diagnosis not present

## 2017-05-01 DIAGNOSIS — M109 Gout, unspecified: Secondary | ICD-10-CM | POA: Diagnosis present

## 2017-05-01 DIAGNOSIS — H409 Unspecified glaucoma: Secondary | ICD-10-CM | POA: Diagnosis present

## 2017-05-01 DIAGNOSIS — Z888 Allergy status to other drugs, medicaments and biological substances status: Secondary | ICD-10-CM | POA: Diagnosis not present

## 2017-05-01 DIAGNOSIS — L03115 Cellulitis of right lower limb: Secondary | ICD-10-CM | POA: Diagnosis not present

## 2017-05-01 DIAGNOSIS — I2510 Atherosclerotic heart disease of native coronary artery without angina pectoris: Secondary | ICD-10-CM | POA: Diagnosis present

## 2017-05-01 DIAGNOSIS — S81801A Unspecified open wound, right lower leg, initial encounter: Secondary | ICD-10-CM | POA: Diagnosis not present

## 2017-05-01 DIAGNOSIS — R531 Weakness: Secondary | ICD-10-CM | POA: Diagnosis not present

## 2017-05-01 DIAGNOSIS — I878 Other specified disorders of veins: Secondary | ICD-10-CM | POA: Diagnosis present

## 2017-05-01 DIAGNOSIS — R451 Restlessness and agitation: Secondary | ICD-10-CM | POA: Diagnosis not present

## 2017-05-01 DIAGNOSIS — I495 Sick sinus syndrome: Secondary | ICD-10-CM | POA: Diagnosis present

## 2017-05-01 DIAGNOSIS — F0390 Unspecified dementia without behavioral disturbance: Principal | ICD-10-CM | POA: Diagnosis present

## 2017-05-01 DIAGNOSIS — D61818 Other pancytopenia: Secondary | ICD-10-CM | POA: Diagnosis not present

## 2017-05-01 DIAGNOSIS — R64 Cachexia: Secondary | ICD-10-CM | POA: Diagnosis not present

## 2017-05-01 DIAGNOSIS — L03119 Cellulitis of unspecified part of limb: Secondary | ICD-10-CM

## 2017-05-01 DIAGNOSIS — X58XXXA Exposure to other specified factors, initial encounter: Secondary | ICD-10-CM | POA: Diagnosis not present

## 2017-05-01 DIAGNOSIS — I872 Venous insufficiency (chronic) (peripheral): Secondary | ICD-10-CM | POA: Diagnosis present

## 2017-05-01 DIAGNOSIS — L03319 Cellulitis of trunk, unspecified: Secondary | ICD-10-CM | POA: Diagnosis not present

## 2017-05-01 DIAGNOSIS — F03C0 Unspecified dementia, severe, without behavioral disturbance, psychotic disturbance, mood disturbance, and anxiety: Secondary | ICD-10-CM

## 2017-05-01 DIAGNOSIS — Z66 Do not resuscitate: Secondary | ICD-10-CM | POA: Diagnosis not present

## 2017-05-01 DIAGNOSIS — E785 Hyperlipidemia, unspecified: Secondary | ICD-10-CM | POA: Diagnosis present

## 2017-05-01 DIAGNOSIS — R600 Localized edema: Secondary | ICD-10-CM | POA: Diagnosis present

## 2017-05-01 LAB — CBC WITH DIFFERENTIAL/PLATELET
BASOS ABS: 0 10*3/uL (ref 0.0–0.1)
BASOS PCT: 0 %
EOS ABS: 0.1 10*3/uL (ref 0.0–0.7)
EOS PCT: 2 %
HCT: 31.3 % — ABNORMAL LOW (ref 36.0–46.0)
Hemoglobin: 9.9 g/dL — ABNORMAL LOW (ref 12.0–15.0)
Lymphocytes Relative: 26 %
Lymphs Abs: 0.9 10*3/uL (ref 0.7–4.0)
MCH: 25.9 pg — ABNORMAL LOW (ref 26.0–34.0)
MCHC: 31.6 g/dL (ref 30.0–36.0)
MCV: 81.9 fL (ref 78.0–100.0)
MONO ABS: 0.3 10*3/uL (ref 0.1–1.0)
Monocytes Relative: 8 %
Neutro Abs: 2.1 10*3/uL (ref 1.7–7.7)
Neutrophils Relative %: 64 %
PLATELETS: 134 10*3/uL — AB (ref 150–400)
RBC: 3.82 MIL/uL — AB (ref 3.87–5.11)
RDW: 16.4 % — ABNORMAL HIGH (ref 11.5–15.5)
WBC: 3.3 10*3/uL — ABNORMAL LOW (ref 4.0–10.5)

## 2017-05-01 LAB — COMPREHENSIVE METABOLIC PANEL
ALBUMIN: 2.8 g/dL — AB (ref 3.5–5.0)
ALT: 8 U/L — AB (ref 14–54)
AST: 17 U/L (ref 15–41)
Alkaline Phosphatase: 57 U/L (ref 38–126)
Anion gap: 7 (ref 5–15)
BUN: 23 mg/dL — AB (ref 6–20)
CHLORIDE: 110 mmol/L (ref 101–111)
CO2: 21 mmol/L — AB (ref 22–32)
CREATININE: 1.54 mg/dL — AB (ref 0.44–1.00)
Calcium: 8.7 mg/dL — ABNORMAL LOW (ref 8.9–10.3)
GFR calc Af Amer: 30 mL/min — ABNORMAL LOW (ref >60)
GFR calc non Af Amer: 26 mL/min — ABNORMAL LOW (ref >60)
Glucose, Bld: 89 mg/dL (ref 65–99)
POTASSIUM: 4.3 mmol/L (ref 3.5–5.1)
SODIUM: 138 mmol/L (ref 135–145)
Total Bilirubin: 0.4 mg/dL (ref 0.3–1.2)
Total Protein: 6.4 g/dL — ABNORMAL LOW (ref 6.5–8.1)

## 2017-05-01 MED ORDER — VANCOMYCIN HCL IN DEXTROSE 1-5 GM/200ML-% IV SOLN
1000.0000 mg | Freq: Once | INTRAVENOUS | Status: AC
  Administered 2017-05-01: 1000 mg via INTRAVENOUS
  Filled 2017-05-01: qty 200

## 2017-05-01 MED ORDER — ACETAMINOPHEN 650 MG RE SUPP: 650.0000 mg | Freq: Four times a day (QID) | RECTAL | Status: DC | PRN

## 2017-05-01 MED ORDER — ACETAMINOPHEN 325 MG PO TABS
650.0000 mg | ORAL_TABLET | Freq: Four times a day (QID) | ORAL | Status: DC | PRN
  Administered 2017-05-03: 650 mg via ORAL
  Filled 2017-05-01: qty 2

## 2017-05-01 MED ORDER — SODIUM CHLORIDE 0.9 % IV BOLUS (SEPSIS)
500.0000 mL | Freq: Once | INTRAVENOUS | Status: AC
  Administered 2017-05-01: 500 mL via INTRAVENOUS

## 2017-05-01 MED ORDER — SODIUM CHLORIDE 0.9 % IV SOLN
500.0000 mg | INTRAVENOUS | Status: DC
Start: 2017-05-03 — End: 2017-05-02

## 2017-05-01 MED ORDER — POLYETHYLENE GLYCOL 3350 17 G PO PACK: 17.0000 g | PACK | Freq: Every day | ORAL | Status: DC | PRN

## 2017-05-01 NOTE — ED Provider Notes (Signed)
[facility] EMERGENCY DEPARTMENT Provider Note   CSN: [id number] Arrival date & time: 05/01/17  1202     History   Chief Complaint Chief Complaint  Patient presents with  . Cellulitis    HPI Ashley Duran is a 83 y.o. female.  HPI Patient presents after being sent in for cellulitis of her lower legs.  Seen in the ER 2 days ago for the same and started on Keflex.  Per nursing notes she has not been taking the medicine and was sent in because the doctor wanted further evaluation.  She lives at home.  Patient states her legs are hurting herself.  States she is ready to die.  She is pleasant but somewhat difficult to get a history from.  Patient states that she gets up and walks around.  States if I went running that she would be able to catch me. Past Medical History:  Diagnosis Date  . Cachexia (HCC)   . CAD Native   . Failure to thrive in adult   . Glaucoma   . GOUT   . Hyperlipidemia   . Hypertension   . Hypertensive heart disease   . Osteoarthritis   . Pancytopenia (HCC)   . Sick sinus syndrome [facility])     Patient Active Problem List   Diagnosis Date Noted  . FTT (failure to thrive) in adult 03/02/2017  . Bilateral lower leg cellulitis 03/02/2017  . Stage 3 chronic kidney disease (HCC) 03/02/2017  . Anemia in stage 3 chronic kidney disease (HCC) 03/02/2017  . Thrombocytopenia (HCC) 03/02/2017  . Chronic venous stasis dermatitis of both lower extremities 03/02/2017  . History of MI (myocardial infarction) 02/19/2017  . Chest pain 01/31/2017  . Non-STEMI (non-ST elevated myocardial infarction) (HCC) 05/19/2013  . Pancytopenia (HCC)   . Gout 05/17/2007  . Hypertensive heart disease   . Coronary atherosclerosis   . Osteoarthritis     Past Surgical History:  Procedure Laterality Date  . CATARACT EXTRACTION    . TOTAL HIP ARTHROPLASTY Left     OB History    No data available       Home Medications    Prior to Admission medications     Medication Sig Start Date End Date Taking? Authorizing Provider  acetaminophen (TYLENOL) 325 MG tablet Take 650 mg by mouth 2 (two) times daily.    [provider]  cephALEXin (KEFLEX) 500 MG capsule Take 1 capsule (500 mg total) by mouth 2 (two) times daily. 04/29/17 05/06/17  Audry Pili, PA-C  HYDROcodone-acetaminophen (NORCO/VICODIN) 5-325 MG tablet Take 1 tablet by mouth every 4 (four) hours as needed. 02/14/17   Jacalyn Lefevre, MD  nitroGLYCERIN (NITROSTAT) 0.4 MG SL tablet Place 0.4 mg under the tongue every 5 (five) minutes as needed for chest pain.    [provider]    Family History No family history on file.  Social History Social History  Substance Use Topics  . Smoking status: Never Smoker  . Smokeless tobacco: Never Used  . Alcohol use No     Allergies   Metoprolol and Simvastatin   Review of Systems Review of Systems  Constitutional: Positive for fatigue.  Respiratory: Negative for shortness of breath.   Gastrointestinal: Negative for abdominal pain.  Genitourinary: Negative for hematuria.  Musculoskeletal: Negative for back pain.  Skin: Positive for wound.  Neurological: Negative for numbness.     Physical Exam Updated Vital Signs BP (!) 156/56 (BP Location: Right Arm)   Pulse 65  Temp 97.7 F (36.5 C) (Oral)   Resp 16   Wt 47.6 kg (105 lb)   LMP  (LMP Unknown)   SpO2 100%   BMI 18.60 kg/m   Physical Exam  Constitutional: She appears well-developed.  Patient is somewhat cachectic  HENT:  Head: Atraumatic.  Eyes: EOM are normal.  Cardiovascular: Normal rate.   Pulmonary/Chest: Effort normal. She has no rales.  Abdominal: She exhibits no distension.  Musculoskeletal: She exhibits edema.  Chronic venous changes with weeping edema bilateral lower extremities.  Some erythema to the knees bilaterally.  There is an ulcer on her anterior right tibia.  Neurological: She is alert.  Skin: Skin is warm. Capillary refill takes less  than 2 seconds.       ED Treatments / Results  Labs (all labs ordered are listed, but only abnormal results are displayed) Labs Reviewed  COMPREHENSIVE METABOLIC PANEL  CBC WITH DIFFERENTIAL/PLATELET    EKG  EKG Interpretation None       Radiology No results found.  Procedures Procedures (including critical care time)  Medications Ordered in ED Medications  vancomycin (VANCOCIN) IVPB 1000 mg/200 mL premix (not administered)     Initial Impression / Assessment and Plan / ED Course  I have reviewed the triage vital signs and the nursing notes.  Pertinent labs & imaging results that were available during my care of the patient were reviewed by me and considered in my medical decision making (see chart for details).     Patient with cellulitis of both lower extremity's.  Lives at home and her hospice nurse has not been taking the medicine at home and is not as active as normal.  Sent in for IV antibiotics.  Per the hospice nurse she is not ready [facility].   Final Clinical Impressions(s) / ED Diagnoses   Final diagnoses:  Cellulitis of lower extremity, unspecified laterality    New Prescriptions New Prescriptions   No medications on file     Benjiman CorePickering, Thaddaeus Granja, MD 05/01/17 1333

## 2017-05-01 NOTE — ED Notes (Signed)
Placed patient on the monitor patient is resting with call bell in reach 

## 2017-05-01 NOTE — Progress Notes (Signed)
[facility] Liaison:  RN visit  Met with patient at bedside.  Patient alert and oriented to self.  Patient states "I dont know why I'm here, I guess it's my time to go".  I asked how she was feeling.  She advised "I feel pretty good.  My legs bother me a little."  I was in the room when Dr. Alvino Chapel came in and he advised that they would try to get patient admitted to treat cellulitis.  I will continue to follow patient while hospitalized.   Please feel free to contact me with any questions or concerns.   Thank you,  Edyth Gunnels, RN, BSN [facility] Liaison [phone]  All hospital liaisons are now on [place].

## 2017-05-01 NOTE — Progress Notes (Signed)
Pharmacy Antibiotic Note  Shona NeedlesMinnie L Duran is a [age] y.o. female admitted on 05/01/2017 with bilateral lower extremity erythema and warmness. Planning to start vancomycin for cellulitis, SCr 1.38, eCrCl ~ 4475m/min.   Plan: -Vancomycin 1 g IV x1 then 500/48h -Monitor renal fx, cultures, may have to dose off of VR  Weight: 105 lb (47.6 kg)  Temp (24hrs), Avg:97.7 F (36.5 C), Min:97.7 F (36.5 C), Max:97.7 F (36.5 C)   Recent Labs Lab 04/29/17 0718  WBC 3.8*  CREATININE 1.38*    Estimated Creatinine Clearance: 15.5 mL/min (A) (by C-G formula based on SCr of 1.38 mg/dL (H)).    Allergies  Allergen Reactions  . Metoprolol     Bradycardia   . Simvastatin     Muscle aches     Antimicrobials this admission: 10/26 vancomycin >  Dose adjustments this admission: N/A  Microbiology results: N/A  Baldemar FridayMasters, Nikesha Kwasny M 05/01/2017 2:10 PM

## 2017-05-01 NOTE — Progress Notes (Signed)
Palliative Care  Consult for PMT noted in chart. [facility] They are aware of her hospitalization and are following (Amy Evans with note in chart and contact information provided). I have reached out to [facility] and they are handling her Palliative/Hospice needs. They will contact PMT if anything additional is needed from our team.     Murrell ConverseSarah Lucifer Soja [facility] Palliative Care [phone]  No charge note.

## 2017-05-01 NOTE — ED Triage Notes (Signed)
Pt arrives via EMS from home alone. Home health RN came to check on pt and stated she was weak and not taking her medications for cellulitis. Her MD sent her for work up on bilateral LE cellulitis.

## 2017-05-01 NOTE — H&P (Signed)
Date: 05/01/2017               Patient Name:  Ashley NeedlesMinnie L Duran MRN: [id number]  DOB: 1913-01-27 Age / Sex: [age] y.o., female   PCP: Patient, No Pcp Per         Medical Service: Internal Medicine Teaching Service         Attending Physician: Dr. Oswaldo DoneVincent, Marquita Palmsuncan Thomas, *    First Contact: Dr. Caron PresumeHelberg Pager: [phone]  Second Contact: Dr. Mikey BussingHoffman Pager: [phone]       After Hours (After 5p/  First Contact Pager: [phone]  weekends / holidays): Second Contact Pager: [phone]   Chief Complaint: Bilateral LE swelling and erythema   History of Present Illness: Ashley Duran is a [age] y.o. Female with a PMHx significant for failure to thrive who is currently on hospice who presented to the ED with worsening bilateral LE erythema and purulent discharge of the right LE. Unfortunately the patient currently has altered mental status. She frequently asks why she is in the hospital, discusses how cold she is, and asks us to put her in a ditch. The majority of her history was obtained via chart review and collateral from her granddaughter Ashley Lingo(Carolyn Mitchell). She lives alone but is unable to care for herself. She continues to state that she is ready to pass away. She does not eat, drink, or take her medication as she should. Family is very concerned about her current living situation. Ashley Duran states she was kicked out of the nursing home for being too much trouble. She has had multiple ED visits for this bilateral lower extremity erythema. Including on 02/14/17 when she came in for bilateral leg pain and failure to thrive. Not eating or drinking. Stated she is "ready to die." Discharged to SNF with Keflex. She then presented to the ED on 10/24, again for bilateral leg pain with weeping. Denies fevers. Leg wound on the right anterior shin. She was given a prescription for Keflex as an outpatient. Today her hospice nurse found her at home weaker than normal and she had not begun the Keflex prescribed. She denies  systemic symptoms of infection including fevers, chills, N/V, abdominal pain, myalgias, arthralgias, SOB, cough, chest pain, or HA. Hospice nurse has seen the patient in the ED. Palliative will follow.   Meds:  Current Meds  Medication Sig  . cephALEXin (KEFLEX) 500 MG capsule Take 1 capsule (500 mg total) by mouth 2 (two) times daily.   Allergies: Allergies as of 05/01/2017 - Review Complete 05/01/2017  Allergen Reaction Noted  . Metoprolol  05/17/2013  . Simvastatin  05/17/2013   Past Medical History:  Diagnosis Date  . Cachexia (HCC)   . CAD Native   . Failure to thrive in adult   . Glaucoma   . GOUT   . Hyperlipidemia   . Hypertension   . Hypertensive heart disease   . Osteoarthritis   . Pancytopenia (HCC)   . Sick sinus syndrome (HCC)    Family History: States that everybody in her family is dead  Daughter died at 81 y.o.   Social History:  Lives alone  Failure to thrive  Review of Systems: A complete ROS was negative except as per HPI.   Physical Exam: Blood pressure (!) 165/76, pulse 71, temperature 97.7 F (36.5 C), temperature source Oral, resp. rate 16, weight 105 lb (47.6 kg), SpO2 99 %.  General: Thin female resting comfortably in bed  HENT: Temporal wasting, thinning of her face,  thinning of her hair, whitening of the scalp Pulm: Diminished air movement but no wheezing or crackles heard CV: RRR, prominent S1 and S2 Abdomen: Active bowel sounds, soft, no tenderness to palpation  Extremities: Lichenification with weeping, erythema, and tenderness to palpation of the LE bilaterally. Purulent discharge from the anterior right shin, wound approximately 2 cm in diameter. (photos in the chart) Skin: Whitening of the scalp and lichenification of the bilateral LEs Neuro: Alert and oriented to person. Not oriented to place or time.   Assessment & Plan by Problem: Active Problems:   Bilateral lower extremity edema  Ashley Duran is a 83 y.o. Female with  failure to thrive who presented with right lower extremity erythema, swelling, and purulent discharge consistent with cellulitis.   1. Purulent Cellulitis, Right LE  - Swelling, erythema, and purulent discharge from the right LE (photos in chart) - No clear boarder  - Recurrent issue, likely due to underlying venous insufficiency  - Failed outpatient treatment with Keflex - Will do IV Vancomycin  2. Stasis Dermatitis  - Lichenification of the bilateral LEs with weeping  - Likely the cause of her recurrent LE cellulitis   3. Failure to Thrive  - Family concerns that the patient is unable to care for herself  - She is not eating/drinking  - Low albumin and total protein  - Consult social work and discuss with hospice about possible placement   4. Pancytopenia  - WBC 3.3, Hgb 9.9, and Plt 134  - All stable from prior labs  - Continue to monitor   Diet: Regular  Code Status: DNR  Dispo: Admit patient to Observation with expected length of stay less than 2 midnights.  Signed: Levora Dredge, MD 05/01/2017, 5:29 PM  My Pager: [phone]

## 2017-05-02 DIAGNOSIS — I878 Other specified disorders of veins: Secondary | ICD-10-CM | POA: Diagnosis present

## 2017-05-02 DIAGNOSIS — E785 Hyperlipidemia, unspecified: Secondary | ICD-10-CM | POA: Diagnosis not present

## 2017-05-02 DIAGNOSIS — L280 Lichen simplex chronicus: Secondary | ICD-10-CM

## 2017-05-02 DIAGNOSIS — I2510 Atherosclerotic heart disease of native coronary artery without angina pectoris: Secondary | ICD-10-CM | POA: Diagnosis present

## 2017-05-02 DIAGNOSIS — I872 Venous insufficiency (chronic) (peripheral): Secondary | ICD-10-CM | POA: Diagnosis present

## 2017-05-02 DIAGNOSIS — R030 Elevated blood-pressure reading, without diagnosis of hypertension: Secondary | ICD-10-CM

## 2017-05-02 DIAGNOSIS — M109 Gout, unspecified: Secondary | ICD-10-CM | POA: Diagnosis present

## 2017-05-02 DIAGNOSIS — I495 Sick sinus syndrome: Secondary | ICD-10-CM | POA: Diagnosis not present

## 2017-05-02 DIAGNOSIS — S81801A Unspecified open wound, right lower leg, initial encounter: Secondary | ICD-10-CM

## 2017-05-02 DIAGNOSIS — F0390 Unspecified dementia without behavioral disturbance: Secondary | ICD-10-CM | POA: Diagnosis present

## 2017-05-02 DIAGNOSIS — I25119 Atherosclerotic heart disease of native coronary artery with unspecified angina pectoris: Secondary | ICD-10-CM | POA: Diagnosis not present

## 2017-05-02 DIAGNOSIS — H409 Unspecified glaucoma: Secondary | ICD-10-CM | POA: Diagnosis not present

## 2017-05-02 DIAGNOSIS — M1993 Secondary osteoarthritis, unspecified site: Secondary | ICD-10-CM | POA: Diagnosis not present

## 2017-05-02 DIAGNOSIS — R64 Cachexia: Secondary | ICD-10-CM | POA: Diagnosis not present

## 2017-05-02 DIAGNOSIS — D61818 Other pancytopenia: Secondary | ICD-10-CM | POA: Diagnosis present

## 2017-05-02 DIAGNOSIS — G909 Disorder of the autonomic nervous system, unspecified: Secondary | ICD-10-CM | POA: Diagnosis present

## 2017-05-02 DIAGNOSIS — I10 Essential (primary) hypertension: Secondary | ICD-10-CM | POA: Diagnosis not present

## 2017-05-02 DIAGNOSIS — Z66 Do not resuscitate: Secondary | ICD-10-CM | POA: Diagnosis present

## 2017-05-02 DIAGNOSIS — R627 Adult failure to thrive: Secondary | ICD-10-CM | POA: Diagnosis present

## 2017-05-02 DIAGNOSIS — R451 Restlessness and agitation: Secondary | ICD-10-CM | POA: Diagnosis not present

## 2017-05-02 DIAGNOSIS — X58XXXA Exposure to other specified factors, initial encounter: Secondary | ICD-10-CM

## 2017-05-02 LAB — BASIC METABOLIC PANEL
Anion gap: 7 (ref 5–15)
BUN: 19 mg/dL (ref 6–20)
CALCIUM: 8.5 mg/dL — AB (ref 8.9–10.3)
CO2: 18 mmol/L — ABNORMAL LOW (ref 22–32)
CREATININE: 1.31 mg/dL — AB (ref 0.44–1.00)
Chloride: 114 mmol/L — ABNORMAL HIGH (ref 101–111)
GFR calc non Af Amer: 32 mL/min — ABNORMAL LOW (ref >60)
GFR, EST AFRICAN AMERICAN: 37 mL/min — AB (ref >60)
Glucose, Bld: 70 mg/dL (ref 65–99)
Potassium: 5 mmol/L (ref 3.5–5.1)
SODIUM: 139 mmol/L (ref 135–145)

## 2017-05-02 LAB — CBC
HCT: 27.7 % — ABNORMAL LOW (ref 36.0–46.0)
Hemoglobin: 9 g/dL — ABNORMAL LOW (ref 12.0–15.0)
MCH: 26.3 pg (ref 26.0–34.0)
MCHC: 32.5 g/dL (ref 30.0–36.0)
MCV: 81 fL (ref 78.0–100.0)
PLATELETS: 124 10*3/uL — AB (ref 150–400)
RBC: 3.42 MIL/uL — AB (ref 3.87–5.11)
RDW: 16.9 % — AB (ref 11.5–15.5)
WBC: 2.9 10*3/uL — AB (ref 4.0–10.5)

## 2017-05-02 NOTE — Progress Notes (Signed)
MD on call notified that patient had an elevated SBP in 180s.  Patient is currently resting and asymptomatic.  No new orders given.  Will continue to monitor patient.

## 2017-05-02 NOTE — Progress Notes (Signed)
   Subjective: Doing well this AM. States that she wants to go home and she will not hurt herself. Has significant cognitive impairment. Spoke with home hospice today. They would like her to be placed in a facility. States that she is unable to care for herself at home and family is unwilling to take the patient in. She is unable to cook, take her medications, or care for herself. She is not a candidate for inpatient hospice ([facility]) at this point.   Objective: Vital signs in last 24 hours: Vitals:   05/01/17 1540 05/01/17 1830 05/01/17 2025 05/02/17 0004  BP: (!) 165/76 (!) 172/95 (!) 190/86 (!) 183/86  Pulse: 71 78 95 77  Resp: 16  18   Temp:      TempSrc:      SpO2: 99% 100% 95% 100%  Weight:   101 lb (45.8 kg)   Height:   5\' 4"  (1.626 m)    General: Thin female eating breakfast this AM Pulm: Good air movement, no wheezing or crackles CV: RRR, prominent S1 and S2 Extremities: Lower extremity lichenification bilaterally. Wound on the anterior right shin with no purulent discharge today   Assessment/Plan:  1. Advance Dementia  - Home hospice does not think she can care for herself at home. Family unwilling to take the patient in. Requesting placement in a facility  - Not a candidate for inpatient hospice [facility]) - Consult social work   2. Stasis Dermatitis 2/2 venous insufficiency  - Lichenification of the bilateral LEs with weeping  - Discontinue vancomycin today  - Consult wound care.  3. Elevated BP - BP grossly elevated to 180/80 overnight  - Appropriately nothing has been added. Given her advanced age likely has some autonomic dysfunction. I would recommend only treating standing blood pressures.   4. Pancytopenia  - WBC 2.9, Hgb 9.0, and Plt 124  - All stable from prior labs  - Continue to monitor   Dispo: Anticipated discharge in approximately >1 day(s).   Levora DredgeHelberg, Juanluis Guastella, MD 05/02/2017, 4:51 AM My Pager: [phone]

## 2017-05-02 NOTE — Progress Notes (Signed)
[place] - GIP RN visit at 1100am.    This is a related and covered GIP admission of 05/01/17 with [facility] diagnosis of CAD, per Dr. Tomasa Hosteller.  Patient has [place] DNR.  [facility] RN activated EMS after discovering patient had not been taking medications at home and worsening of cellulitis on BLE.  Patient was admitted to hospital for treatment of same.  Per [facility] RN, it is questionable if patient can return to living at home alone.  Met with patient at bedside.  Patient was sitting up on side of the bed and very agitated when I came into the room.  Patient was talking a lot.  Patient is alert, but somewhat disoriented.  Patient kept repeating "I can't tell you why I'm here".  Patient was further stating how she wanted to go ahead and "be put in a hole and covered up".  Patient in NAD at this time.  Patient BLE were extremely dry, but did have areas of breakthrough bleeding.  CNA's were at bedside and aware.  Patient denies pain at this time, but states "my legs feel tight".  I spoke with Dr. Tarri Abernethy this morning about her legs and treatment.  Stasis dermatitis from venous insufficiency versus what was initially thought of as cellulitis.  As there is question about going home, he will follow up with social work to inquire about placement at a facility.  Patient receiving:  No medications today.  Patient did receive vancomycin yesterday in the ED.   Advised Dr. Tomasa Hosteller and Dr. Wynonia Lawman of patient admission to hospital.  Transfer summary and medication list placed on the shadow chart.   If you have any hospice related questions, please feel free to contact me.   Thank you.  Edyth Gunnels, RN, BSN [facility] Liaison [phone]  All hospital liaisons are now on [place].

## 2017-05-02 NOTE — Progress Notes (Signed)
Patient evaluated at bedside for reports of agitation by nursing. On arrival patient is fully clothed, sitting in chair, adjusting her hair. Patient perseverating on wanting to go back to the barn [place] which she is staying [address]; she wonders where her grandson, Ashley Duran, is as he's supposed to take her back. When told she is in the hospital she responds that she has been conned and nothing is wrong with her legs. Other orientation questioning not possible as patient continued perseverating on topics such as bed alarms causing people to fall, and not believing man walked on the moon.   I spoke to her POA Ashley Duran to update her on the above; she states that this is the patient's usual state. She mentioned that her son Ashley Duran had just left visiting his grandmother. She again re-iterated that patient is not able to take care of herself at home.  Plan: --sitter at bedside --at this time patient is not a danger to herself or others; will consider low dose sedatives if this does become an issue and not resolved with re-orientation.  [name], MD IMTS - PGY2 Pager [phone]

## 2017-05-03 LAB — BASIC METABOLIC PANEL
Anion gap: 7 (ref 5–15)
BUN: 23 mg/dL — AB (ref 6–20)
CHLORIDE: 110 mmol/L (ref 101–111)
CO2: 19 mmol/L — AB (ref 22–32)
CREATININE: 1.44 mg/dL — AB (ref 0.44–1.00)
Calcium: 8.7 mg/dL — ABNORMAL LOW (ref 8.9–10.3)
GFR calc Af Amer: 33 mL/min — ABNORMAL LOW (ref >60)
GFR calc non Af Amer: 28 mL/min — ABNORMAL LOW (ref >60)
Glucose, Bld: 98 mg/dL (ref 65–99)
Potassium: 4.9 mmol/L (ref 3.5–5.1)
SODIUM: 136 mmol/L (ref 135–145)

## 2017-05-03 LAB — CBC
HCT: 30.8 % — ABNORMAL LOW (ref 36.0–46.0)
Hemoglobin: 10 g/dL — ABNORMAL LOW (ref 12.0–15.0)
MCH: 26.4 pg (ref 26.0–34.0)
MCHC: 32.5 g/dL (ref 30.0–36.0)
MCV: 81.3 fL (ref 78.0–100.0)
PLATELETS: 140 10*3/uL — AB (ref 150–400)
RBC: 3.79 MIL/uL — ABNORMAL LOW (ref 3.87–5.11)
RDW: 16.2 % — AB (ref 11.5–15.5)
WBC: 4 10*3/uL (ref 4.0–10.5)

## 2017-05-03 MED ORDER — WHITE PETROLATUM EX OINT
TOPICAL_OINTMENT | CUTANEOUS | Status: DC | PRN
  Administered 2017-05-03: 09:00:00 via TOPICAL
  Filled 2017-05-03 (×2): qty 28.35

## 2017-05-03 NOTE — Progress Notes (Signed)
   Subjective:  Patient was evaluated this morning. She was sleeping comfortably in bed. When woken she states she doesn't know why she is here.   Objective: Vital signs in last 24 hours: Vitals:   05/02/17 0624 05/02/17 1705 05/02/17 2209 05/03/17 0712  BP: (!) 183/66 (!) 188/72 (!) 185/79 (!) 180/84  Pulse: 84 73 71 97  Resp: (!) 21 20 19 16   Temp: 98.1 F (36.7 C)  98.1 F (36.7 C) 98.3 F (36.8 C)  TempSrc: Oral  Oral Oral  SpO2: 100% 100% 100% 93%  Weight: 98 lb 14.4 oz (44.9 kg)     Height:       General: Thin female in no acute distress Pulm: Good air movement, no wheezing or crackles CV: RRR, prominent S1 and S2 Extremities: Lower extremity lichenification bilaterally. Wound on the anterior right shin with bandage   Assessment/Plan:  1. Advance Dementia:  Home hospice does not think she can care for herself at home and has asked we facilitate placement of patient. Family unwilling to take the patient in. Social work has been consulted to help with placement. - Not a candidate for inpatient hospice [employer] [facility]) - Consult social work  - delirium precautions  2. Stasis Dermatitis 2/2 venous insufficiency:  Patient was originally admitted for treatment of cellulitis. She received vancomycin in the ED. Upon evaluation there are no signs of infection in her bilateral erythematous most likely stasis dermatitis secondary to venous insufficiency. There is Lichenification of the bilateral LEs with weeping.  Patient will need good wound care and hygiene for her lower extremity bilaterally. - wound care - vaseline for legs  3. Elevated BP:  BP elevated to 180/80 and stable.  It does not appear that she has blood pressure medications at home. Given her advanced age and likely having autonomic dysfunction would hold off on treating her current blood pressures. If increases to >200/100 would consider low-dose hydralazine. - monitor  4. Pancytopenia:  WBC 2.9, Hgb 9.0, and Plt 124    - All stable from prior labs  - Continue to monitor, get repeat bmet and cbc today  Dispo: Anticipated discharge pending placement.   Geralyn CorwinHoffman, Antawn Sison [place], DO 05/03/2017, 7:26 AM My Pager: [phone]

## 2017-05-03 NOTE — Progress Notes (Signed)
[facility] Social [facility] note Patient [facility] for dx of heart disease. She has some dementia, yet lives alone at older adult apartment [place]. She has a granddaughter, Eber JonesCarolyn and a Surveyor, mininggreat-grandson, Sharlet SalinaBenjamin (Charlann BoxerCharlie Brown)  who are primary caregivers when patient allows. She has been declining for several months with increased weakness, leg issues and new onset of leg pain. She has had ongoing chest pain. [facility] and family checked her out as she was not adjusting well- calling them daily to come get her and cursing at facility staff. Patient cannot remember to take scheduled medications at home and does not comply with wound care treatments provided. She would benefit from placement in a facility as she cannot manage at home and family are unable to offer 24/7 care she needs. Patient is not suicidal and has been asking to die for four years, wishing she could go to sleep, fly away. Patient is a headstrong, jovial, expressive [age] yr old. Patient was asleep upon arrival with staff, Stephanie sitting at bedside. Please contact LCSW with any further questions or for follow-up regarding placement.  Orson GearMelanie Fuqua, Kentucky[place]CSW [phone]

## 2017-05-03 NOTE — Progress Notes (Signed)
[facility] [id number] - [facility] [place] [facility] - GIP RN visit at 0950am.    This is a related and covered GIP admission of 05/01/17 with [facility] diagnosis of CAD, per Dr. Barbee ShropshireHertweck.  Patient has [facility] DNR.  [facility] RN activated EMS after discovering patient had not been taking medications at home and worsening of cellulitis on BLE.  Patient was admitted to hospital for treatment of same.  Per [facility] RN, it is questionable if patient can return to living at home alone.  Visited with patient at bedside.  Patient was asleep.  Patient has a sitter at bedside who advised that she had been "given something, cause she had some pain earlier".  Good rise and fall of chest.  Patient appears comfortable.  I spoke with Shawna OrleansMelanie, [facility] CSW, about patient and that social work had been consulted to try and get patient placed.  Evidently, this had happened in August and patient had been placed, but relative pulled her out.  Shawna OrleansMelanie will be visiting with patient today.  We will continue to follow while hospitalized.   Patient receiving:  PRN medications: acetaminophen (TYLENOL) tablet 650 mg, Dose 650 mg, Q6H PRN via PO at 0833am.  White petrolatum (VASELINE) gel, as needed via TP for dry skin at 0833.  No other medications received today.   Thank you,  Adele BarthelAmy Evans, RN, BSN [facility] Hospital Liaison [phone]  All hospital liaisons are now on [place].

## 2017-05-03 NOTE — Progress Notes (Signed)
Notified MD of patient becoming more agitated. Stating she wanted to leave. Also spoke with POA who stated she couldn't handle her anymore at home and if we needed to restrain her we could. I notified the MD about her comments. MD said they could come evaluate. Waiting on new orders. Madelin RearLonnie Quinntin Malter, MSN, RN, [employer]RN

## 2017-05-04 DIAGNOSIS — F0390 Unspecified dementia without behavioral disturbance: Secondary | ICD-10-CM

## 2017-05-04 DIAGNOSIS — F03C0 Unspecified dementia, severe, without behavioral disturbance, psychotic disturbance, mood disturbance, and anxiety: Secondary | ICD-10-CM

## 2017-05-04 NOTE — [place] FL2 (Signed)
  [place] MEDICAID FL2 LEVEL OF CARE SCREENING TOOL     IDENTIFICATION  Patient Name: Ashley Duran Birthdate: [birth year] Sex: female Admission Date (Current Location): 05/01/2017  [facility] [place]   Facility and Address:  [facility], [address], [place], [place] [address]      Provider Number: [id number]  Attending Physician Name and Address:  Tyson AliasVincent, Duncan Thomas, *  Relative Name and Phone Number:       Current Level of Care: Hospital Recommended Level of Care: Skilled Nursing Facility Prior Approval Number:    Date Approved/Denied:   PASRR Number: [id number]  Discharge Plan: SNF    Current Diagnoses: Patient Active Problem List   Diagnosis Date Noted  . Advanced dementia 05/04/2017  . Venous (peripheral) insufficiency 05/02/2017  . Dementia 03/02/2017  . Bilateral lower leg cellulitis 03/02/2017  . Stage 3 chronic kidney disease (HCC) 03/02/2017  . Anemia in stage 3 chronic kidney disease (HCC) 03/02/2017  . Thrombocytopenia (HCC) 03/02/2017  . Chronic venous stasis dermatitis of both lower extremities 03/02/2017  . History of MI (myocardial infarction) 02/19/2017  . Chest pain 01/31/2017  . Non-STEMI (non-ST elevated myocardial infarction) (HCC) 05/19/2013  . Pancytopenia (HCC)   . Gout 05/17/2007  . Hypertensive heart disease   . Coronary atherosclerosis   . Osteoarthritis     Orientation RESPIRATION BLADDER Height & Weight     Self  Normal Continent Weight: 44.9 kg (98 lb 14.4 oz) Height:  5\' 4"  (162.6 cm)  BEHAVIORAL SYMPTOMS/MOOD NEUROLOGICAL BOWEL NUTRITION STATUS      Continent Diet (see DC summary)  AMBULATORY STATUS COMMUNICATION OF NEEDS Skin   Limited Assist Verbally Normal                       Personal Care Assistance Level of Assistance  Bathing, Dressing Bathing Assistance: Limited assistance   Dressing Assistance: Limited assistance     Functional Limitations Info              SPECIAL CARE FACTORS FREQUENCY  PT (By licensed PT), OT (By licensed OT)     PT Frequency: 5/wk OT Frequency: 5/wk            Contractures      Additional Factors Info  Code Status, Allergies Code Status Info: DNR Allergies Info: Metoprolol, Simvastatin           Current Medications (05/04/2017):  This is the current hospital active medication list Current Facility-Administered Medications  Medication Dose Route Frequency Provider Last Rate Last Dose  . acetaminophen (TYLENOL) tablet 650 mg  650 mg Oral Q6H PRN Geralyn CorwinHoffman, Jessica Ratliff, DO   650 mg at 05/03/17 [id number]   Or  . acetaminophen (TYLENOL) suppository 650 mg  650 mg Rectal Q6H PRN Geralyn CorwinHoffman, Jessica Ratliff, DO      . polyethylene glycol (MIRALAX / GLYCOLAX) packet 17 g  17 g Oral Daily PRN Hoffman, Jessica Ratliff, DO      . white petrolatum (VASELINE) gel   Topical PRN Camelia PhenesHoffman, Jessica Ratliff, DO         Discharge Medications: Please see discharge summary for a list of discharge medications.  Relevant Imaging Results:  Relevant Lab Results:   Additional Information SS#: [id number]   Hospice to continue to follow patient at the SNF.   Mearl LatinNadia S Lewanda Perea, LCSWA

## 2017-05-04 NOTE — Discharge Summary (Signed)
Name: Ashley Duran MRN: [id number] DOB: 28-Aug-1912 83 y.o. PCP: Patient, No Pcp Per  Date of Admission: 05/01/2017 12:02 PM Date of Discharge: 05/05/2017 Attending Physician: Tyson Alias, *  Discharge Diagnosis: 1. Advanced Dementia  2. Chronic Venous Insufficiency with Stasis Dermatitis   Principal Problem:   Advanced dementia Active Problems:   Chronic venous stasis dermatitis of both lower extremities   Venous (peripheral) insufficiency  Discharge Medications: Allergies as of 05/05/2017      Reactions   Metoprolol    Bradycardia    Simvastatin    Muscle aches       Medication List    STOP taking these medications   cephALEXin 500 MG capsule Commonly known as:  KEFLEX     TAKE these medications   acetaminophen 325 MG tablet Commonly known as:  TYLENOL Take 650 mg by mouth 2 (two) times daily.   HYDROcodone-acetaminophen 5-325 MG tablet Commonly known as:  NORCO/VICODIN Take 1 tablet by mouth every 4 (four) hours as needed.   nitroGLYCERIN 0.4 MG SL tablet Commonly known as:  NITROSTAT Place 0.4 mg under the tongue every 5 (five) minutes as needed for chest pain.   white petrolatum Oint Commonly known as:  VASELINE Apply 1 application topically as needed for dry skin.      Disposition and follow-up:   Ashley Duran was discharged from [facility] in Stable condition.  At the hospital follow up visit please address:  1.  Chronic Venous Insufficiency with Stasis Dermatitis. Recommend good skin hygiene and hydration. No antibiotics recommended at this point.   2.  Labs / imaging needed at time of follow-up: None  3.  Pending labs/ test needing follow-up: None  Hospital Course by problem list: Principal Problem:   Advanced dementia Active Problems:   Chronic venous stasis dermatitis of both lower extremities   Venous (peripheral) insufficiency   1. Advanced Dementia. Ashley Duran is a 83 y.o. Female with a  PMHx significant for dementia and on home hospice who presented to the ED from hoe for evaluation and management of bilateral LE erythema and pain. She was previously evaluated in the ED on 10/24 and it was felt that she had bilateral cellulitis. She was discharged home on Keflex. When her hospice nurse came to her house to check on her she noticed that Ashley Duran was weak and had not been taking her medication. She was then sent to the ED for further evaluation. She was initially started on Vancomycin however, it was subsequently stopped has her LE were felt to have stasis dermatitis rather than cellulitis. After discussing her care with her family and hospice nurse there was significant concern that the Ashley Duran is unable to care for herself at home. She currently lives alone and her granddaughter and grandson are unable to provide the 24 hour care needed. Her cognition is severely limited and continuously asks why she is in the hospital. Hospice does not feel she is appropriate for inpatient hospice and is requesting the social work aid in the placement of Ashley Duran. However, after through discussion with the patient, her granddaughter, and the home hospice team it was decided that she can return to her apartment with home hospice services. She was discharged in stable condition and no changes were made to her outpatient medical management.    2. Chronic Venous Insufficiency with Stasis Dermatitis. Her LE have skin changes consistent with stasis dermatitis. She does have a single wound on  the right anterior mid shin. Wound care was consulted while she was in the hospital and her legs where treated with Vaseline QD. There is currently no infection and antibiotics would not be of use in her current situation.   Discharge Vitals:   BP (!) 184/96 (BP Location: Right Arm)   Pulse 90   Temp 98.6 F (37 C) (Oral)   Resp 17   Ht 5\' 4"  (1.626 m)   Wt 98 lb 14.4 oz (44.9 kg)   LMP  (LMP Unknown)    SpO2 99%   BMI 16.98 kg/m   Discharge Instructions: Discharge Instructions    Diet - low sodium heart healthy    Complete by:  As directed    Increase activity slowly    Complete by:  As directed      Signed: Levora Duran, Ashley Levay, MD 05/05/2017, 12:53 PM   My Pager: [phone]

## 2017-05-04 NOTE — Clinical Social Work Note (Signed)
Clinical Social Work Assessment  Patient Details  Name: Ashley Duran MRN: [id number] Date of Birth: July 27, 1912  Date of referral:  05/04/17               Reason for consult:  Facility Placement                Permission sought to share information with:  Facility Medical sales representativeContact Representative, Family Supports Permission granted to share information::  No  Name::     Leisure centre managerCarolyn  Agency::  SNFs  Relationship::  Granddaughter  Contact Information:  [phone]  Housing/Transportation Living arrangements for the past 2 months:  Apartment Source of Information:  Adult Children Patient Interpreter Needed:  None Criminal Activity/Legal Involvement Pertinent to Current Situation/Hospitalization:  No - Comment as needed Significant Relationships:  Adult Children Lives with:  Self Do you feel safe going back to the place where you live?  No Need for family participation in patient care:  Yes (Comment)  Care giving concerns:  CSW received consult for possible SNF placement at time of discharge. CSW spoke with patient's granddaughter regarding recommendation of SNF placement at time of discharge. Patient's granddaughter reported that she is unable to help manage patient at home anymore and that the last SNF she was at [facility] she "acted out" and so they will not take her back. Patient's granddaughter expressed understanding of recommendation and is agreeable to SNF placement at time of discharge. CSW to continue to follow and assist with discharge planning needs.   Social Worker assessment / plan:  CSW spoke with patient's granddaughter concerning possibility of SNF with Hospice.   Employment status:  Retired Health and safety inspectornsurance information:  [place] (Comment Required) PT Recommendations:  Not assessed at this time Information / Referral to community resources:  Skilled Nursing Facility  Patient/Family's Response to care:  Patient's granddaughter stated that patient has Medicaid. CSW  contacting facilities to see if they will accept patient on Medicaid since patient's granddaughter cannot afford to pay privately for SNF with Hospice and patient will not participate in PT to go under her Medicare benefit.   Patient/Family's Understanding of and Emotional Response to Diagnosis, Current Treatment, and Prognosis:  Patient/family is realistic regarding therapy needs and expressed being hopeful for SNF placement. Patient's granddaughter expressed understanding of CSW role and discharge process as well as medical condition. No questions/concerns about plan or treatment.    Emotional Assessment Appearance:  Appears stated age Attitude/Demeanor/Rapport:  Unable to Assess Affect (typically observed):  Unable to Assess Orientation:  Oriented to Self Alcohol / Substance use:  Not Applicable Psych involvement (Current and /or in the community):  No (Comment)  Discharge Needs  Concerns to be addressed:  Care Coordination Readmission within the last 30 days:  No Current discharge risk:  None Barriers to Discharge:  Continued Medical Work up   Ashley Duran, LCSWA 05/04/2017, 4:58 PM

## 2017-05-04 NOTE — Progress Notes (Signed)
[facility] [phone] - [facility] - GIP RN visit at 0945.   This is a related and covered GIP admission of 05/01/17 with [facility] diagnosis of CAD, per Dr. Barbee ShropshireHertweck. Patient has OOF DNR. [facility] RN activated EMS after discovering patient had not been taking medications at home and worsening of cellulitis on BLE. Patient was admitted to hospital for treatment of same. Per [facility] RN, it is questionable if patient can return to living at home alone.  Visited with patient at bedside, she is quite animated and talkative this morning. She questions why "she is still here, everyone else is gone, she just wants to be in the ground 6 ft and to throw some dirt on top."   She denies any pain currently. She has a Comptrollersitter at bedside.   No continuous medications. Nothing for pain in last 24 hours.  Will continue to follow while hospitalized and anticipate any discharge needs.   If patient is discharged and needs ambulance transport, please call [facility] for transportation as pt is an existing [facility] patient.  Please call with any hospice related questions or concerns.  Thank you, Haynes Bastracy Ennis, RN, BSN [facility] Hospital Liaison [phone]  [facility]ia[facility] are on AMION.

## 2017-05-04 NOTE — Progress Notes (Signed)
   Subjective: Continues to be unsure why she is in the hospital. Discussed that she was admitted for her legs and now will need to go to rehab. She continuously asks why she is here even after discussion. Does not have any issues this AM aside from feeling like we are not giving her all the information. Will touch base with social work today to discuss placement options.   Objective: Vital signs in last 24 hours: Vitals:   05/03/17 1837 05/03/17 2140 05/04/17 0007 05/04/17 0430  BP: (!) 187/86 (!) 177/88 (!) 178/90 (!) 175/102  Pulse: 100 95 89 84  Resp: (!) 22 18  19   Temp: 97.9 F (36.6 C) 98.9 F (37.2 C)  99.6 F (37.6 C)  TempSrc: Oral Oral  Oral  SpO2: 99% 99% 100% 97%  Weight:      Height:       General: Thin female in no acute distress Pulm: Good air movement, no wheezing or crackles  CV: RRR, prominent S1 and S2 Abdomen: Soft, non-distended, no tenderness to palpation  Extremities: Lichenification of the LE bilaterally with open wound on the right anterior mid shin  Assessment/Plan:  1. Advance Dementia:   - Home hospice does not think she can care for herself at home and has asked we facilitate placement of patient. Family unwilling to take the patient in. Social work has been consulted to help with placement. - Not a candidate for inpatient hospice [facility]) - Will discuss with social work about placement   - Delirium precautions  2. Stasis Dermatitis 2/2 venous insufficiency:   - Patient was originally admitted for treatment of cellulitis. She received vancomycin in the ED. Upon evaluation there are no signs of infection in her bilateral erythematous most likely stasis dermatitis secondary to venous insufficiency. There is Lichenification of the bilateral LEs with weeping.  Patient will need good wound care and hygiene for her lower extremity bilaterally. - Wound care - Vaseline for legs  3. Elevated BP:   - BP elevated to 180/80 and stable.  - It does not  appear that she has blood pressure medications at home. Given her advanced age and likely having autonomic dysfunction would hold off on treating her current blood pressures. If increases to >200/100 would consider low-dose hydralazine. - Monitor  4. Pancytopenia:   - WBC 4.0, Hgb 10.0, and Plt 140  - All stable from prior labs  - Continue to monitor  Dispo: Anticipated discharge pending placement.   Levora DredgeHelberg, Robecca Fulgham, MD 05/04/2017, 5:58 AM My Pager: [phone]

## 2017-05-05 MED ORDER — HYDROCERIN EX CREA
TOPICAL_CREAM | Freq: Every day | CUTANEOUS | Status: DC
  Administered 2017-05-05: 13:00:00 via TOPICAL
  Filled 2017-05-05: qty 113

## 2017-05-05 MED ORDER — WHITE PETROLATUM EX OINT: 1.0000 "application " | TOPICAL_OINTMENT | CUTANEOUS | 0 refills | Status: AC | PRN

## 2017-05-05 NOTE — Progress Notes (Signed)
[facility] Work note LCSW met with patient to offer support. Patient was sitting on side of the bed eating breakfast meal of bacon, grits. Patient shares no memory of coming to the hospital and asks what happened. She verbalizes desire to return home as she feels better and states, "they ain't doing anything to my legs". Patient does appear back to her baseline of cognitive functioning and conversation. Patient began to get anxious and agitated when LCSW discussed plan for transfer to nursing home vs home. She shared no intention of going to nursing home and reports, "I'd put up a fight". Patient shares no concerns with ability to care for self at home and indicates that she still can manage there. Patient asks LCSW to, "tell them I'm not going".  Patient reported chest pain and RN arrived to assess. LCSW spoke with RN, Raven and will coordinate care with hospital CSW.   Katherina Right, [place]

## 2017-05-05 NOTE — Consult Note (Addendum)
WOC Nurse wound consult note Reason for Consult: Consult for BLE.  Pt has dry peeling skin. Wound type: Right leg with partial thickness wound; 2X1X.1cm, pink and dry Dressing procedure/placement/frequency: Foam dressing to protect and promote healing to wound.  Eucerin cream for dry peeling skin. Please re-consult if further assistance is needed.  Thank-you,  Cammie Mcgeeawn Ivan Maskell MSN, RN, CWOCN, [place]CN-AP, CNS [phone]

## 2017-05-05 NOTE — Progress Notes (Signed)
2nd attempt to call pts. Pervis HockingGrandson Ben to arrange for key. No response, left a voicemail with callback number. Will continue to monitor.

## 2017-05-05 NOTE — Progress Notes (Signed)
CSW received consult regarding PT recommendation of SNF at discharge.  Patient is refusing SNF. Patient's granddaughter understands and is in agreement for patient to discharge back to her apartment with Hospice. She apologized to CSW for patient's various behaviors, but expected this outcome. RN will need to contact patient's grandson, Romeo AppleBen, at time of discharge so he can bring the apartment key for when PTAR is set up.   CSW signing off.   Osborne Cascoadia Izaah Westman LCSWA [phone]

## 2017-05-05 NOTE — Progress Notes (Signed)
Family updated about patient leaving. PTAR advised of location of patients key. Vitals are stable. Skin is intact except as charted in most recent assessments. Pt to be escorted out by EMS, to be driven home.

## 2017-05-05 NOTE — Progress Notes (Signed)
   Subjective: Very cold this AM. Unable to recall why she is in the hospital. Thinks she is in a nursing home and would like to go home. Refers to her grandson as Charlann BoxerCharlie Brown and would like him to come visit. She has sensitive skin and her legs are tender to palpation. Discussed the plan to go to a nursing facility but quickly reverts back to asking why she is here. Will contact granddaughter and social work today after rounds.   Objective: Vital signs in last 24 hours: Vitals:   05/04/17 0430 05/04/17 1323 05/04/17 2335 05/05/17 0248  BP: (!) 175/102 (!) 170/82 (!) 168/93 (!) 185/100  Pulse: 84 80 83 88  Resp: 19 18 18 16   Temp: 99.6 F (37.6 C) 98.9 F (37.2 C) 98.3 F (36.8 C) 97.8 F (36.6 C)  TempSrc: Oral Oral Oral Oral  SpO2: 97% 100% 100% 100%  Weight:      Height:       General: Thin female resting comfortably in bed.  Pulm: Good air movement, no wheezing or crackles  CV: RRR, prominent S1 and S2 Abdomen: Active bowel sounds, soft, no tenderness to palpation  Extremities: Lichenification of the LEs, with skin sloughing, lesion to the right mid anterior shin without purulence today or overlying bandage.   Assessment/Plan:  1. Advance Dementia:  - Home hospice does not think she can care for herself at home and has asked we facilitate placement of patient. Family unwilling to take the patient in. Social work has been consulted to help with placement. - Not a candidate for inpatient hospice [facility] - Delirium precautions - Waiting on placement. FL2 sent out yesterday  - Will contact social work and granddaughter today   2. Stasis Dermatitis 2/2 venous insufficiency:  - Patient was originally admitted for treatment of cellulitis. She received vancomycin in the ED. Upon evaluation there are no signs of infection in her bilateral erythematous most likely stasis dermatitis secondary to venous insufficiency. There is Lichenification of the bilateral LEs with weeping.  Patient will need good wound care and hygiene for her lower extremity bilaterally. - Wound care - Vaseline for legs  3. Elevated BP:  - BP elevated to 180/80 and stable.  -It does not appear that she has blood pressure medications at home. Given her advanced age and likely having autonomic dysfunction would hold off on treating her current blood pressures. If increases to >200/100 would consider low-dose hydralazine. - Monitor  Pancytopenia. Stable   Dispo: Anticipated discharge pending placement.   Levora DredgeHelberg, Vere Diantonio, MD 05/05/2017, 5:05 AM My Pager: [phone]

## 2017-05-05 NOTE — Progress Notes (Signed)
Alonda the nurse tech called me that pt c/o chest pain, went to pt room to check up on her  but pt said she doesn't feel no pain and the pain did not radiated and lasted for about a minute, BP 185/100, P88 EKG shows normal sinus rythm  With lt ventricular hypertrophy, on call physician from [facility]  Was paged called back on phone with no orders, will continue to monitor pt

## 2017-05-05 NOTE — Progress Notes (Addendum)
[facility] [id number] - [facility] - GIP RN visit at 0930   This is a related and covered GIP admission of 05/01/17 with [facility] diagnosis of CAD, per Dr. Barbee ShropshireHertweck. Patient has OOF DNR. [facility] RN activated EMS after discovering patient had not been taking medications at home and worsening of cellulitis on BLE. Patient was admitted to hospital for treatment of same. Per [facility] RN, it is questionable if patient can return to living at home alone.  Visited with patient at bedside, she very talkative and a little agitated this morning. She is adamant that she wants to go home. She states she just wants to be by herself in her home.   She does not report any pain or discomfort and does not appear to be in any distress.   No continuous medications. Nothing for pain in last 24 hours.  Will continue to follow while hospitalized and anticipate any discharge needs.   If patient is discharged and needs ambulance transport, please call [facility] for transportation as pt is an existing [facility] patient.  Please call with any hospice related questions or concerns.  Thank you,  Elsie SaasMary Anne Robertson, RN, [facility] Hospital Liaison [phone]  [facility] Hospital Liaisons are on AMION.

## 2017-05-06 NOTE — Consult Note (Signed)
            [facility] Primary [facility] Navigator  05/06/2017  Shona NeedlesMinnie L Mcsweeney 1913/05/15 [id number]   Attempt to see patient at the bedside to identify possible discharge needs but she was already discharged per RN report.  Per chart review, patient was discharged home yesterday with Hospice. Patient had refused skilled nursing facility that was recommended by PT and patient's granddaughter was in agreement for patient to discharge back to her apartment with Hospice.   For additional questions please contact:  Karin GoldenLorraine A. Zyion Leidner, BSN, RN-BC [facility] PRIMARY CARE Navigator Cell: [phone]

## 2017-06-06 DEATH — deceased

## 2018-06-15 IMAGING — DX DG CHEST 2V
2 series · 2 of 2 positions shown · non-contrast
Comparison: [DATE]

CLINICAL DATA: Chest pain

EXAM:
CHEST  2 VIEW

[chest lat]
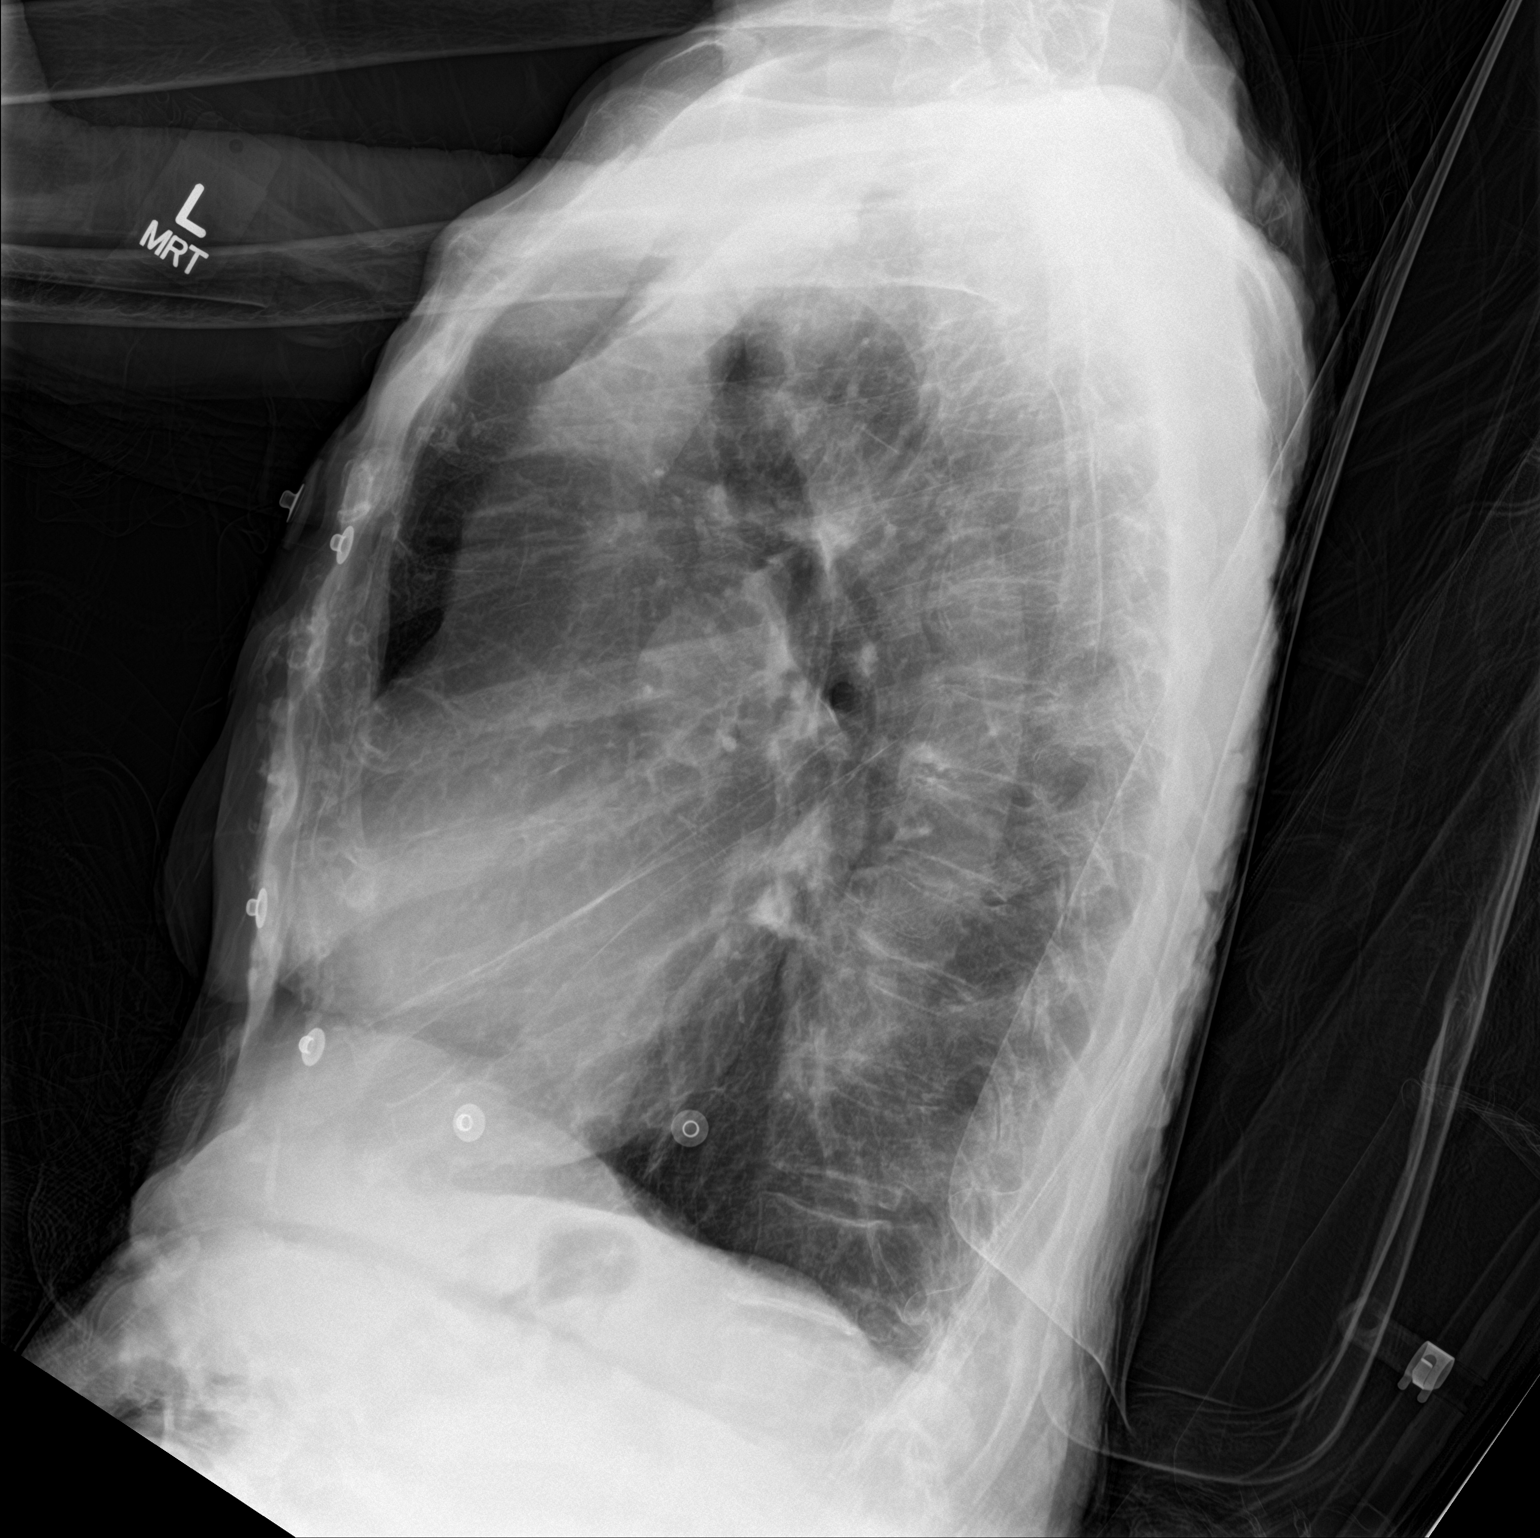

[chest ap]
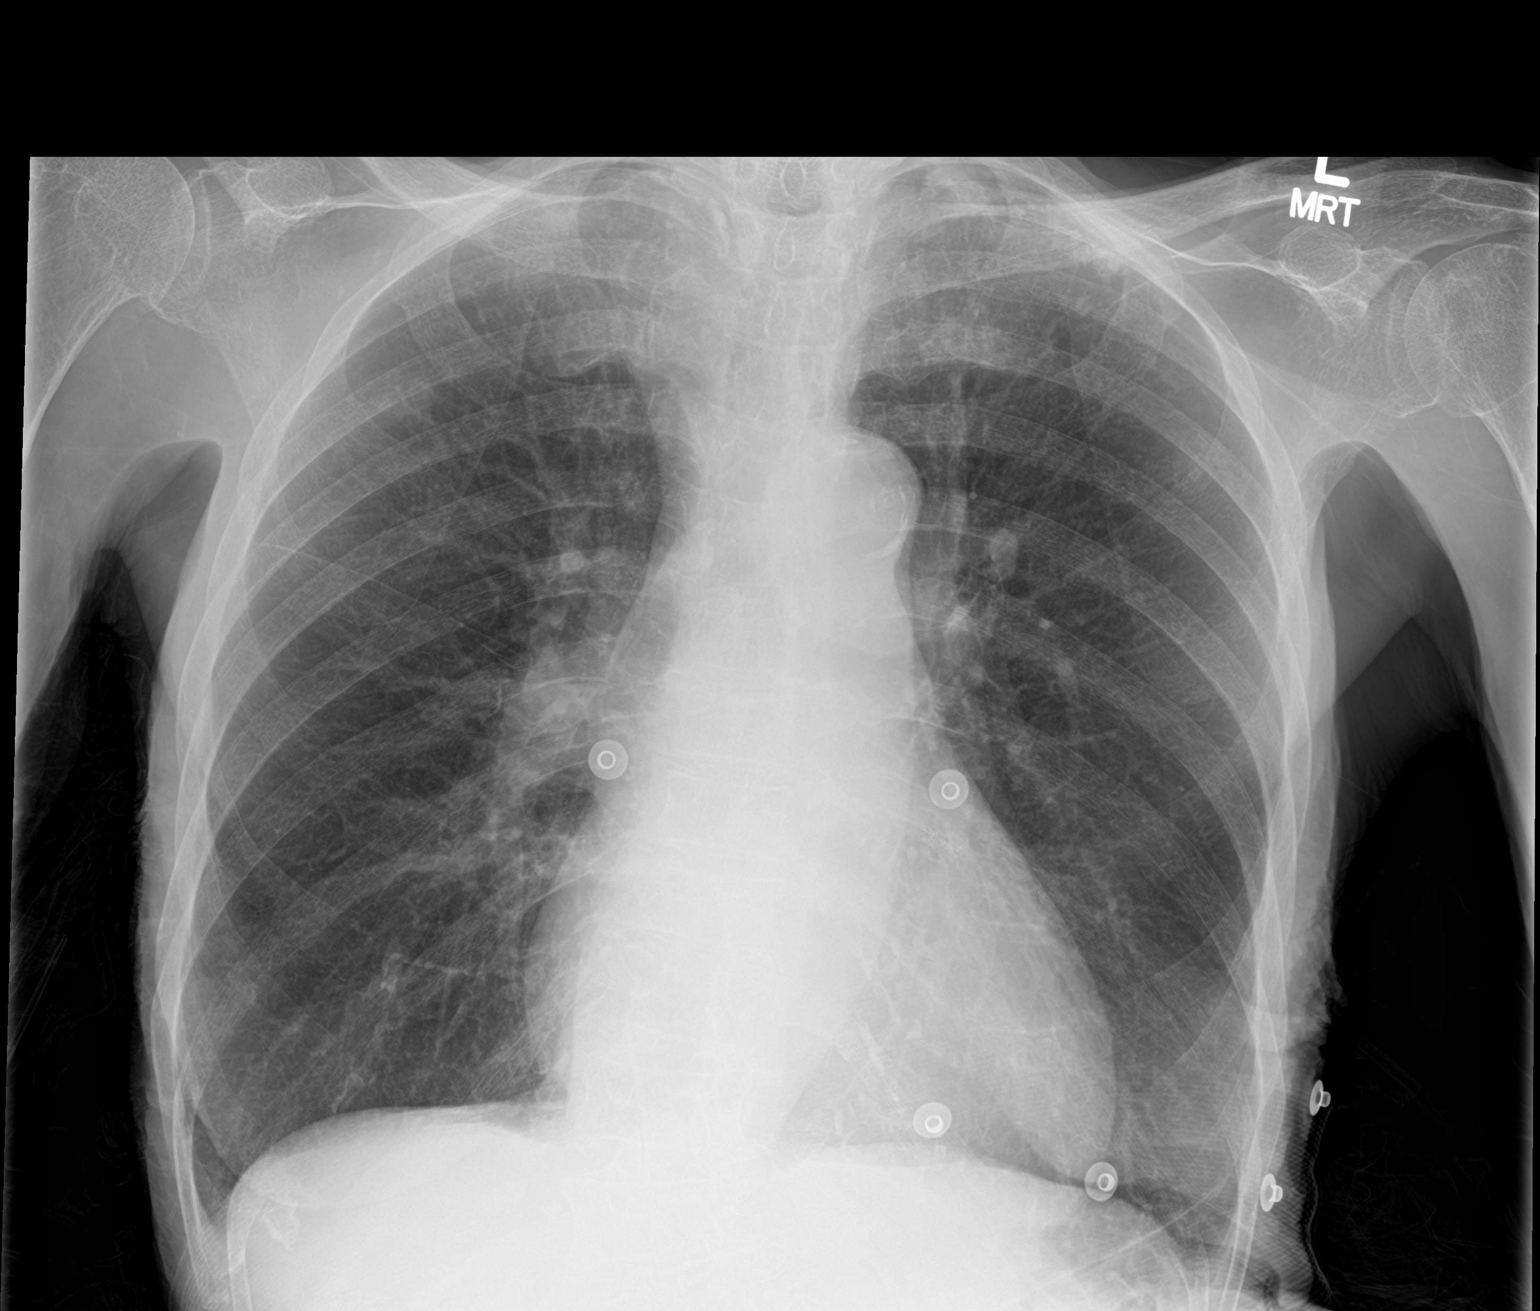

[2 of 2 positions shown; findings below may reference images not displayed]

FINDINGS: Mild cardiac enlargement. No pleural effusion or edema identified.
Aortic atherosclerosis is noted. No airspace opacities.
IMPRESSION: 1. No acute cardiopulmonary abnormalities.
2.  Aortic Atherosclerosis ([id number]).
